# Patient Record
Sex: Female | Born: 1950 | Race: White | Hispanic: No | Marital: Married | Smoking: Never smoker
Health system: Southern US, Community
[De-identification: ages and names within clinical notes are randomized; demographics above are authoritative.]

## PROBLEM LIST (undated history)

## (undated) DIAGNOSIS — C50919 Malignant neoplasm of unspecified site of unspecified female breast: Secondary | ICD-10-CM

## (undated) DIAGNOSIS — I10 Essential (primary) hypertension: Secondary | ICD-10-CM

## (undated) DIAGNOSIS — B019 Varicella without complication: Secondary | ICD-10-CM

## (undated) DIAGNOSIS — G709 Myoneural disorder, unspecified: Secondary | ICD-10-CM

## (undated) DIAGNOSIS — M1990 Unspecified osteoarthritis, unspecified site: Secondary | ICD-10-CM

## (undated) DIAGNOSIS — J302 Other seasonal allergic rhinitis: Secondary | ICD-10-CM

## (undated) DIAGNOSIS — G4730 Sleep apnea, unspecified: Secondary | ICD-10-CM

## (undated) HISTORY — PX: BACK SURGERY: SHX140

## (undated) HISTORY — DX: Malignant neoplasm of unspecified site of unspecified female breast: C50.919

## (undated) HISTORY — PX: OTHER SURGICAL HISTORY: SHX169

## (undated) HISTORY — PX: COLON SURGERY: SHX602

## (undated) HISTORY — PX: DIAGNOSTIC LAPAROSCOPY: SUR761

## (undated) HISTORY — PX: DILATION AND CURETTAGE OF UTERUS: SHX78

## (undated) HISTORY — DX: Varicella without complication: B01.9

---

## 2010-06-03 DIAGNOSIS — L680 Hirsutism: Secondary | ICD-10-CM

## 2010-06-03 DIAGNOSIS — E221 Hyperprolactinemia: Secondary | ICD-10-CM | POA: Insufficient documentation

## 2010-06-03 HISTORY — DX: Hyperprolactinemia: E22.1

## 2010-06-03 HISTORY — DX: Hirsutism: L68.0

## 2011-02-25 LAB — HM DEXA SCAN: HM Dexa Scan: NORMAL

## 2017-01-10 DIAGNOSIS — Z86018 Personal history of other benign neoplasm: Secondary | ICD-10-CM | POA: Diagnosis not present

## 2017-01-10 DIAGNOSIS — G629 Polyneuropathy, unspecified: Secondary | ICD-10-CM | POA: Diagnosis not present

## 2017-01-10 DIAGNOSIS — Z6841 Body Mass Index (BMI) 40.0 and over, adult: Secondary | ICD-10-CM | POA: Diagnosis not present

## 2017-01-10 DIAGNOSIS — I10 Essential (primary) hypertension: Secondary | ICD-10-CM | POA: Diagnosis not present

## 2017-03-29 DIAGNOSIS — Z1211 Encounter for screening for malignant neoplasm of colon: Secondary | ICD-10-CM | POA: Diagnosis not present

## 2017-03-29 DIAGNOSIS — Z1212 Encounter for screening for malignant neoplasm of rectum: Secondary | ICD-10-CM | POA: Diagnosis not present

## 2017-03-30 LAB — COLOGUARD: Cologuard: NEGATIVE

## 2017-05-10 ENCOUNTER — Other Ambulatory Visit: Payer: Self-pay

## 2017-05-31 ENCOUNTER — Other Ambulatory Visit: Payer: Self-pay | Admitting: Internal Medicine

## 2017-05-31 DIAGNOSIS — Z1231 Encounter for screening mammogram for malignant neoplasm of breast: Secondary | ICD-10-CM

## 2017-06-02 ENCOUNTER — Ambulatory Visit
Admission: RE | Admit: 2017-06-02 | Discharge: 2017-06-02 | Disposition: A | Discharge status: Home / Self Care | Payer: Medicare Other | Source: Ambulatory Visit | Attending: Internal Medicine | Admitting: Internal Medicine

## 2017-06-02 ENCOUNTER — Encounter: Payer: Self-pay | Admitting: Radiology

## 2017-06-02 DIAGNOSIS — Z1231 Encounter for screening mammogram for malignant neoplasm of breast: Secondary | ICD-10-CM | POA: Diagnosis not present

## 2017-06-14 DIAGNOSIS — M25512 Pain in left shoulder: Secondary | ICD-10-CM | POA: Diagnosis not present

## 2017-09-06 DIAGNOSIS — M5412 Radiculopathy, cervical region: Secondary | ICD-10-CM | POA: Insufficient documentation

## 2017-09-06 DIAGNOSIS — M25512 Pain in left shoulder: Secondary | ICD-10-CM | POA: Diagnosis not present

## 2017-09-20 DIAGNOSIS — M5412 Radiculopathy, cervical region: Secondary | ICD-10-CM | POA: Diagnosis not present

## 2017-09-26 DIAGNOSIS — M5412 Radiculopathy, cervical region: Secondary | ICD-10-CM | POA: Diagnosis not present

## 2017-09-30 DIAGNOSIS — M5412 Radiculopathy, cervical region: Secondary | ICD-10-CM | POA: Diagnosis not present

## 2017-10-04 DIAGNOSIS — M5412 Radiculopathy, cervical region: Secondary | ICD-10-CM | POA: Diagnosis not present

## 2017-10-05 DIAGNOSIS — M5412 Radiculopathy, cervical region: Secondary | ICD-10-CM | POA: Diagnosis not present

## 2017-10-07 DIAGNOSIS — M5412 Radiculopathy, cervical region: Secondary | ICD-10-CM | POA: Diagnosis not present

## 2017-10-10 DIAGNOSIS — M50223 Other cervical disc displacement at C6-C7 level: Secondary | ICD-10-CM | POA: Diagnosis not present

## 2017-10-10 DIAGNOSIS — M47812 Spondylosis without myelopathy or radiculopathy, cervical region: Secondary | ICD-10-CM | POA: Diagnosis not present

## 2017-10-12 DIAGNOSIS — M5412 Radiculopathy, cervical region: Secondary | ICD-10-CM | POA: Diagnosis not present

## 2017-10-21 DIAGNOSIS — M5412 Radiculopathy, cervical region: Secondary | ICD-10-CM | POA: Diagnosis not present

## 2017-11-02 DIAGNOSIS — M5412 Radiculopathy, cervical region: Secondary | ICD-10-CM | POA: Diagnosis not present

## 2017-12-20 DIAGNOSIS — M542 Cervicalgia: Secondary | ICD-10-CM | POA: Diagnosis not present

## 2018-03-16 DIAGNOSIS — E7800 Pure hypercholesterolemia, unspecified: Secondary | ICD-10-CM | POA: Diagnosis not present

## 2018-03-16 DIAGNOSIS — I10 Essential (primary) hypertension: Secondary | ICD-10-CM | POA: Diagnosis not present

## 2018-03-16 DIAGNOSIS — Z86018 Personal history of other benign neoplasm: Secondary | ICD-10-CM | POA: Diagnosis not present

## 2018-03-16 DIAGNOSIS — G629 Polyneuropathy, unspecified: Secondary | ICD-10-CM | POA: Diagnosis not present

## 2018-03-16 DIAGNOSIS — N183 Chronic kidney disease, stage 3 (moderate): Secondary | ICD-10-CM | POA: Diagnosis not present

## 2018-03-16 DIAGNOSIS — N939 Abnormal uterine and vaginal bleeding, unspecified: Secondary | ICD-10-CM | POA: Diagnosis not present

## 2018-03-18 ENCOUNTER — Other Ambulatory Visit: Payer: Self-pay | Admitting: Family Medicine

## 2018-03-18 DIAGNOSIS — N939 Abnormal uterine and vaginal bleeding, unspecified: Secondary | ICD-10-CM

## 2018-03-27 ENCOUNTER — Ambulatory Visit
Admission: RE | Admit: 2018-03-27 | Discharge: 2018-03-27 | Disposition: A | Discharge status: Home / Self Care | Payer: Medicare Other | Source: Ambulatory Visit | Attending: Family Medicine | Admitting: Family Medicine

## 2018-03-27 DIAGNOSIS — N939 Abnormal uterine and vaginal bleeding, unspecified: Secondary | ICD-10-CM

## 2018-04-03 DIAGNOSIS — M79671 Pain in right foot: Secondary | ICD-10-CM | POA: Diagnosis not present

## 2018-04-17 DIAGNOSIS — R9389 Abnormal findings on diagnostic imaging of other specified body structures: Secondary | ICD-10-CM | POA: Diagnosis not present

## 2018-04-17 DIAGNOSIS — Z538 Procedure and treatment not carried out for other reasons: Secondary | ICD-10-CM | POA: Diagnosis not present

## 2018-04-17 DIAGNOSIS — N950 Postmenopausal bleeding: Secondary | ICD-10-CM | POA: Diagnosis not present

## 2018-04-17 DIAGNOSIS — N882 Stricture and stenosis of cervix uteri: Secondary | ICD-10-CM | POA: Diagnosis not present

## 2018-04-19 ENCOUNTER — Encounter (HOSPITAL_COMMUNITY): Payer: Self-pay | Admitting: *Deleted

## 2018-04-19 NOTE — Patient Instructions (Addendum)
Your procedure is scheduled on: Thursday, 9/19  Enter through the Main Entrance of [facility] at: 6 am  Pick up the phone at the desk and dial [phone].  Call this number if you have problems the morning of surgery: [phone].  Remember: Do NOT eat food or Do NOT drink clear liquids (including water) after midnight Wednesday.  Take these medicines the morning of surgery with a SIP OF WATER:  None  Brush your teeth on the day of surgery.  Bring CPAP with you on day of surgery.  Stop herbal medications, vitamin supplements, Ibuprofen/NSAIDS at this time.  Do NOT wear jewelry (body piercing), metal hair clips/bobby pins, make-up, or nail polish. Do NOT wear lotions, powders, or perfumes.  You may wear deoderant. Do NOT shave for 48 hours prior to surgery. Do NOT bring valuables to the hospital.  Have a responsible adult drive you home and stay with you for 24 hours after your procedure.  Home with Husband Christy Strickland cell [phone].

## 2018-04-21 DIAGNOSIS — Z01818 Encounter for other preprocedural examination: Secondary | ICD-10-CM | POA: Diagnosis not present

## 2018-04-23 NOTE — H&P (Signed)
67yo PM female who presents for hysteroscopy, D&C under ultrasound guidance due to PMB and cervical stenosis. She reports that on 8/4-8/11 she had light pink to red spotting. The bleeding was most noticeable when she went to the bathroom and wiped. It barely required even a pantyliner. Prior to this it had been several years since she had bleeding.  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  03/27/18: [place] 7.4cm uterus with thickened lining- 14cm. Ovaries not seen.   Current Medications  Taking   HCTZ 25 MG 25 MG Tablet 1 tablet Orally q AM   Vitamin C Immune Health(Ascorbic Acid) 500 MG Tablet Chewable 1 tablet Orally Once a day   Multivitamin . Tablet 1 tablet by mouth Once daily   Unknown   Indomethacin 50 MG Capsule 1 capsule with food or milk Orally three times aday   Medication List reviewed and reconciled with the patient    Past Medical History  Hypertension.   Menstrual Cycle stopped at 15.   Partial Pituitary Removal.   Hormone Issues.   Sleep Apnea.   Stage 3 CKD.   Obesity.   Peripheral neuropathy.   Hyperlipidemia.    Surgical History  Lower Back Surgery   2 C-Sections   Colonoscopy   2 D & C   2 Total Knee Replacement   Abdominal Mass ( benign)    Family History  No Family History documented.   Social History  General:  Alcohol: Rare.  Children: 2, Boys.  Caffeine: 1 serving daily, coffee.  DIET: drinks water.  EDUCATION: Some College.  Tobacco use  cigarettes: Never smoked Tobacco history last updated 04/03/2018 Marital Status: married.  OCCUPATION: House wife.  Exercise: 3 times weekly.    Gyn History  Last pap smear date 2016.  Last mammogram date 2018.    OB History  Pregnancy # 1 NSVD x 2.    Allergies  Demerol: Aggitates  Augmentin  Mushroom: Itching   Hospitalization/Major Diagnostic Procedure  Meningitis   Child Birth x 2   Breast Infection    Review of Systems  CONSTITUTIONAL:  no Chills.  no Fever. no Night sweats.  HEENT:  Blurrred vision no. no Double vision.  CARDIOLOGY:  no Chest pain.  RESPIRATORY:  no Shortness of breath. no Cough.  UROLOGY:  no Urinary frequency. no Urinary incontinence. no Urinary urgency.  GASTROENTEROLOGY:  no Abdominal pain. no Appetite change. Bloating/belching yes. no Change in bowel movements.  FEMALE REPRODUCTIVE:  no Breast lumps or discharge. no Breast pain.  NEUROLOGY:  no Dizziness. no Headache. no Loss of consciousness. Tingling/numbness yes.  PSYCHOLOGY:  no Anxiety. no Depression.  SKIN:  no Rash. no Hives.  HEMATOLOGY/LYMPH:  no Anemia. no Fatigue. Using Blood Thinners no.     Vital Signs  Wt 261.4, Wt change -3.4 lb, Ht 66, BMI 42.19, Pulse sitting 80, BP sitting 140/90.   Examination  General Examination: CONSTITUTIONAL: well developed, well nourished.  SKIN: warm and dry, no rashes.  NECK: supple, normal appearance.  LUNGS: clear to auscultation bilaterally, no wheezes, rhonchi, rales.  HEART: no murmurs, regular rate and rhythm.  ABDOMEN: obese, soft and non-tender, no rebound, no guarding.  FEMALE GENITOURINARY: normal external genitalia, labia - unremarkable, vagina - pink moist mucosa, no lesions or abnormal discharge, difficult visualizing cervix- long vaginal canal-cervix visualized- os not appreciated.  MUSCULOSKELETAL no calf tenderness bilaterally.  EXTREMITIES: no edema present.  PSYCH: appropriate mood and affect.     A/P: 15XW  PM female who presents for hysteroscopy, D&C with ultrasound guidance due to cervical stenosis and bleeding -NPO -LR @ 125cc/hr -antibiotics not indicated -SCDs to OR -Benefits, risk and alternatives reviewed with patient including but not limited to risk of bleeding, infection, uterine perforation or inability to complete procedure  Janyth Pupa, DO [phone] (cell) [phone] (office)

## 2018-04-24 ENCOUNTER — Other Ambulatory Visit: Payer: Self-pay

## 2018-04-24 ENCOUNTER — Encounter (HOSPITAL_COMMUNITY)
Admission: RE | Admit: 2018-04-24 | Discharge: 2018-04-24 | Disposition: A | Discharge status: Home / Self Care | Payer: Medicare Other | Source: Ambulatory Visit | Attending: Obstetrics & Gynecology | Admitting: Obstetrics & Gynecology

## 2018-04-24 ENCOUNTER — Encounter (HOSPITAL_COMMUNITY): Payer: Self-pay

## 2018-04-24 DIAGNOSIS — N950 Postmenopausal bleeding: Secondary | ICD-10-CM

## 2018-04-24 DIAGNOSIS — Z79899 Other long term (current) drug therapy: Secondary | ICD-10-CM | POA: Diagnosis not present

## 2018-04-24 DIAGNOSIS — Z01818 Encounter for other preprocedural examination: Secondary | ICD-10-CM | POA: Insufficient documentation

## 2018-04-24 DIAGNOSIS — G4730 Sleep apnea, unspecified: Secondary | ICD-10-CM | POA: Diagnosis not present

## 2018-04-24 DIAGNOSIS — E785 Hyperlipidemia, unspecified: Secondary | ICD-10-CM | POA: Diagnosis not present

## 2018-04-24 DIAGNOSIS — I129 Hypertensive chronic kidney disease with stage 1 through stage 4 chronic kidney disease, or unspecified chronic kidney disease: Secondary | ICD-10-CM | POA: Diagnosis not present

## 2018-04-24 DIAGNOSIS — E669 Obesity, unspecified: Secondary | ICD-10-CM | POA: Diagnosis not present

## 2018-04-24 DIAGNOSIS — G629 Polyneuropathy, unspecified: Secondary | ICD-10-CM | POA: Diagnosis not present

## 2018-04-24 DIAGNOSIS — Z6841 Body Mass Index (BMI) 40.0 and over, adult: Secondary | ICD-10-CM | POA: Diagnosis not present

## 2018-04-24 DIAGNOSIS — N882 Stricture and stenosis of cervix uteri: Secondary | ICD-10-CM | POA: Diagnosis not present

## 2018-04-24 DIAGNOSIS — N183 Chronic kidney disease, stage 3 (moderate): Secondary | ICD-10-CM | POA: Diagnosis not present

## 2018-04-24 DIAGNOSIS — Z96659 Presence of unspecified artificial knee joint: Secondary | ICD-10-CM | POA: Diagnosis not present

## 2018-04-24 DIAGNOSIS — N840 Polyp of corpus uteri: Secondary | ICD-10-CM | POA: Diagnosis not present

## 2018-04-24 HISTORY — DX: Myoneural disorder, unspecified: G70.9

## 2018-04-24 HISTORY — DX: Other seasonal allergic rhinitis: J30.2

## 2018-04-24 HISTORY — DX: Sleep apnea, unspecified: G47.30

## 2018-04-24 LAB — CBC
HCT: 44.3 % (ref 36.0–46.0)
Hemoglobin: 14.9 g/dL (ref 12.0–15.0)
MCH: 30.7 pg (ref 26.0–34.0)
MCHC: 33.6 g/dL (ref 30.0–36.0)
MCV: 91.3 fL (ref 78.0–100.0)
PLATELETS: 374 10*3/uL (ref 150–400)
RBC: 4.85 MIL/uL (ref 3.87–5.11)
RDW: 13.2 % (ref 11.5–15.5)
WBC: 6.4 10*3/uL (ref 4.0–10.5)

## 2018-04-24 LAB — COMPREHENSIVE METABOLIC PANEL
ALT: 19 U/L (ref 0–44)
ANION GAP: 10 (ref 5–15)
AST: 19 U/L (ref 15–41)
Albumin: 4.1 g/dL (ref 3.5–5.0)
Alkaline Phosphatase: 59 U/L (ref 38–126)
BUN: 16 mg/dL (ref 8–23)
CHLORIDE: 99 mmol/L (ref 98–111)
CO2: 28 mmol/L (ref 22–32)
Calcium: 10 mg/dL (ref 8.9–10.3)
Creatinine, Ser: 1.02 mg/dL — ABNORMAL HIGH (ref 0.44–1.00)
GFR calc non Af Amer: 56 mL/min — ABNORMAL LOW (ref >60)
Glucose, Bld: 87 mg/dL (ref 70–99)
Potassium: 3.5 mmol/L (ref 3.5–5.1)
SODIUM: 137 mmol/L (ref 135–145)
Total Bilirubin: 0.6 mg/dL (ref 0.3–1.2)
Total Protein: 8.4 g/dL — ABNORMAL HIGH (ref 6.5–8.1)

## 2018-04-24 NOTE — Progress Notes (Signed)
Called patient to inform her to bring CPAP machine with her on day of surgery.

## 2018-04-24 NOTE — Pre-Procedure Instructions (Signed)
Called patient to inform her to bring CPAP machine with her on day to surgery.  Patient verbalized understanding.

## 2018-04-26 NOTE — Anesthesia Preprocedure Evaluation (Signed)
Anesthesia Evaluation  Patient identified by MRN, date of birth, ID band Patient awake    Reviewed: Allergy & Precautions, H&P , NPO status , Patient's Chart, lab work & pertinent test results, reviewed documented beta blocker date and time   Airway Mallampati: II  TM Distance: >3 FB Neck ROM: full    Dental no notable dental hx.    Pulmonary neg pulmonary ROS,    Pulmonary exam normal breath sounds clear to auscultation       Cardiovascular Exercise Tolerance: Good hypertension, Pt. on medications negative cardio ROS   Rhythm:regular Rate:Normal     Neuro/Psych negative neurological ROS  negative psych ROS   GI/Hepatic negative GI ROS, Neg liver ROS,   Endo/Other  negative endocrine ROS  Renal/GU negative Renal ROS  negative genitourinary   Musculoskeletal   Abdominal   Peds  Hematology negative hematology ROS (+)   Anesthesia Other Findings   Reproductive/Obstetrics negative OB ROS                             Anesthesia Physical Anesthesia Plan  ASA: II  Anesthesia Plan: General   Post-op Pain Management:    Induction: Intravenous  PONV Risk Score and Plan: 3 and Ondansetron, Treatment may vary due to age or medical condition, Propofol infusion and Dexamethasone  Airway Management Planned: LMA  Additional Equipment:   Intra-op Plan:   Post-operative Plan: Extubation in OR  Informed Consent: I have reviewed the patients History and Physical, chart, labs and discussed the procedure including the risks, benefits and alternatives for the proposed anesthesia with the patient or authorized representative who has indicated his/her understanding and acceptance.   Dental Advisory Given  Plan Discussed with: CRNA, Surgeon and Anesthesiologist  Anesthesia Plan Comments: ( )        Anesthesia Quick Evaluation

## 2018-04-27 ENCOUNTER — Ambulatory Visit (HOSPITAL_COMMUNITY): Payer: Medicare Other

## 2018-04-27 ENCOUNTER — Ambulatory Visit (HOSPITAL_COMMUNITY): Payer: Medicare Other | Admitting: Anesthesiology

## 2018-04-27 ENCOUNTER — Encounter (HOSPITAL_COMMUNITY)
Admission: RE | Disposition: A | Discharge status: Home / Self Care | Payer: Self-pay | Source: Ambulatory Visit | Attending: Obstetrics & Gynecology

## 2018-04-27 ENCOUNTER — Ambulatory Visit (HOSPITAL_COMMUNITY)
Admission: RE | Admit: 2018-04-27 | Discharge: 2018-04-27 | Disposition: A | Discharge status: Home / Self Care | Payer: Medicare Other | Source: Ambulatory Visit | Attending: Obstetrics & Gynecology | Admitting: Obstetrics & Gynecology

## 2018-04-27 ENCOUNTER — Encounter (HOSPITAL_COMMUNITY): Payer: Self-pay | Admitting: Emergency Medicine

## 2018-04-27 DIAGNOSIS — Z96659 Presence of unspecified artificial knee joint: Secondary | ICD-10-CM | POA: Insufficient documentation

## 2018-04-27 DIAGNOSIS — N882 Stricture and stenosis of cervix uteri: Secondary | ICD-10-CM | POA: Diagnosis not present

## 2018-04-27 DIAGNOSIS — E785 Hyperlipidemia, unspecified: Secondary | ICD-10-CM | POA: Insufficient documentation

## 2018-04-27 DIAGNOSIS — N950 Postmenopausal bleeding: Secondary | ICD-10-CM | POA: Diagnosis not present

## 2018-04-27 DIAGNOSIS — N183 Chronic kidney disease, stage 3 (moderate): Secondary | ICD-10-CM | POA: Diagnosis not present

## 2018-04-27 DIAGNOSIS — N840 Polyp of corpus uteri: Secondary | ICD-10-CM | POA: Diagnosis not present

## 2018-04-27 DIAGNOSIS — Z419 Encounter for procedure for purposes other than remedying health state, unspecified: Secondary | ICD-10-CM

## 2018-04-27 DIAGNOSIS — Z79899 Other long term (current) drug therapy: Secondary | ICD-10-CM | POA: Diagnosis not present

## 2018-04-27 DIAGNOSIS — Z6841 Body Mass Index (BMI) 40.0 and over, adult: Secondary | ICD-10-CM | POA: Diagnosis not present

## 2018-04-27 DIAGNOSIS — G629 Polyneuropathy, unspecified: Secondary | ICD-10-CM | POA: Insufficient documentation

## 2018-04-27 DIAGNOSIS — R9389 Abnormal findings on diagnostic imaging of other specified body structures: Secondary | ICD-10-CM | POA: Diagnosis not present

## 2018-04-27 DIAGNOSIS — I129 Hypertensive chronic kidney disease with stage 1 through stage 4 chronic kidney disease, or unspecified chronic kidney disease: Secondary | ICD-10-CM | POA: Diagnosis not present

## 2018-04-27 DIAGNOSIS — E669 Obesity, unspecified: Secondary | ICD-10-CM | POA: Diagnosis not present

## 2018-04-27 DIAGNOSIS — G4730 Sleep apnea, unspecified: Secondary | ICD-10-CM | POA: Diagnosis not present

## 2018-04-27 HISTORY — DX: Essential (primary) hypertension: I10

## 2018-04-27 HISTORY — PX: DILATATION & CURETTAGE/HYSTEROSCOPY WITH MYOSURE: SHX6511

## 2018-04-27 SURGERY — DILATATION & CURETTAGE/HYSTEROSCOPY WITH MYOSURE
Anesthesia: General

## 2018-04-27 MED ORDER — LIDOCAINE-EPINEPHRINE 1 %-1:100000 IJ SOLN
INTRAMUSCULAR | Status: DC | PRN
  Administered 2018-04-27: 7 mL

## 2018-04-27 MED ORDER — ONDANSETRON HCL 4 MG/2ML IJ SOLN: 4.0000 mg | Freq: Once | INTRAMUSCULAR | Status: DC | PRN

## 2018-04-27 MED ORDER — PROPOFOL 10 MG/ML IV BOLUS
INTRAVENOUS | Status: AC
  Filled 2018-04-27: qty 20

## 2018-04-27 MED ORDER — LACTATED RINGERS IV SOLN
INTRAVENOUS | Status: DC
  Administered 2018-04-27: 06:00:00 via INTRAVENOUS

## 2018-04-27 MED ORDER — KETOROLAC TROMETHAMINE 30 MG/ML IJ SOLN
INTRAMUSCULAR | Status: AC
  Filled 2018-04-27: qty 1

## 2018-04-27 MED ORDER — MEPERIDINE HCL 25 MG/ML IJ SOLN: 6.2500 mg | INTRAMUSCULAR | Status: DC | PRN

## 2018-04-27 MED ORDER — MIDAZOLAM HCL 2 MG/2ML IJ SOLN
INTRAMUSCULAR | Status: AC
  Filled 2018-04-27: qty 2

## 2018-04-27 MED ORDER — FENTANYL CITRATE (PF) 100 MCG/2ML IJ SOLN
INTRAMUSCULAR | Status: AC
  Filled 2018-04-27: qty 2

## 2018-04-27 MED ORDER — OXYCODONE HCL 5 MG PO TABS: 5.0000 mg | ORAL_TABLET | Freq: Once | ORAL | Status: DC | PRN

## 2018-04-27 MED ORDER — ACETAMINOPHEN 325 MG PO TABS: 325.0000 mg | ORAL_TABLET | ORAL | Status: DC | PRN

## 2018-04-27 MED ORDER — KETOROLAC TROMETHAMINE 30 MG/ML IJ SOLN
INTRAMUSCULAR | Status: DC | PRN
  Administered 2018-04-27: 15 mg via INTRAVENOUS

## 2018-04-27 MED ORDER — DEXAMETHASONE SODIUM PHOSPHATE 10 MG/ML IJ SOLN
INTRAMUSCULAR | Status: DC | PRN
  Administered 2018-04-27: 4 mg via INTRAVENOUS

## 2018-04-27 MED ORDER — OXYCODONE HCL 5 MG/5ML PO SOLN: 5.0000 mg | Freq: Once | ORAL | Status: DC | PRN

## 2018-04-27 MED ORDER — PROPOFOL 10 MG/ML IV BOLUS
INTRAVENOUS | Status: DC | PRN
  Administered 2018-04-27: 150 mg via INTRAVENOUS

## 2018-04-27 MED ORDER — FENTANYL CITRATE (PF) 100 MCG/2ML IJ SOLN: 25.0000 ug | INTRAMUSCULAR | Status: DC | PRN

## 2018-04-27 MED ORDER — MIDAZOLAM HCL 2 MG/2ML IJ SOLN
INTRAMUSCULAR | Status: DC | PRN
  Administered 2018-04-27: 1 mg via INTRAVENOUS

## 2018-04-27 MED ORDER — LIDOCAINE HCL (CARDIAC) PF 100 MG/5ML IV SOSY
PREFILLED_SYRINGE | INTRAVENOUS | Status: AC
  Filled 2018-04-27: qty 5

## 2018-04-27 MED ORDER — ACETAMINOPHEN 160 MG/5ML PO SOLN: 325.0000 mg | ORAL | Status: DC | PRN

## 2018-04-27 MED ORDER — ONDANSETRON HCL 4 MG/2ML IJ SOLN
INTRAMUSCULAR | Status: AC
  Filled 2018-04-27: qty 2

## 2018-04-27 MED ORDER — ONDANSETRON HCL 4 MG/2ML IJ SOLN
INTRAMUSCULAR | Status: DC | PRN
  Administered 2018-04-27: 4 mg via INTRAVENOUS

## 2018-04-27 MED ORDER — SODIUM CHLORIDE 0.9 % IR SOLN
Status: DC | PRN
  Administered 2018-04-27: 3000 mL

## 2018-04-27 MED ORDER — DEXAMETHASONE SODIUM PHOSPHATE 4 MG/ML IJ SOLN
INTRAMUSCULAR | Status: AC
  Filled 2018-04-27: qty 1

## 2018-04-27 MED ORDER — LIDOCAINE HCL (CARDIAC) PF 100 MG/5ML IV SOSY
PREFILLED_SYRINGE | INTRAVENOUS | Status: DC | PRN
  Administered 2018-04-27: 50 mg via INTRAVENOUS

## 2018-04-27 MED ORDER — FENTANYL CITRATE (PF) 100 MCG/2ML IJ SOLN
INTRAMUSCULAR | Status: DC | PRN
  Administered 2018-04-27 (×2): 50 ug via INTRAVENOUS

## 2018-04-27 SURGICAL SUPPLY — 15 items
CANISTER SUCT 3000ML PPV (MISCELLANEOUS) ×3 IMPLANT
CATH ROBINSON RED A/P 16FR (CATHETERS) ×3 IMPLANT
DEVICE MYOSURE LITE (MISCELLANEOUS) ×3 IMPLANT
DEVICE MYOSURE REACH (MISCELLANEOUS) IMPLANT
DILATOR CANAL MILEX (MISCELLANEOUS) ×3 IMPLANT
GLOVE BIOGEL PI IND STRL 7.0 (GLOVE) ×4 IMPLANT
GLOVE BIOGEL PI INDICATOR 7.0 (GLOVE) ×2
GLOVE ECLIPSE 6.5 STRL STRAW (GLOVE) ×3 IMPLANT
GOWN STRL REUS W/TWL LRG LVL3 (GOWN DISPOSABLE) ×6 IMPLANT
PACK VAGINAL MINOR WOMEN LF (CUSTOM PROCEDURE TRAY) ×3 IMPLANT
PAD OB MATERNITY 4.3X12.25 (PERSONAL CARE ITEMS) ×3 IMPLANT
SEAL ROD LENS SCOPE MYOSURE (ABLATOR) ×3 IMPLANT
TOWEL OR 17X24 6PK STRL BLUE (TOWEL DISPOSABLE) ×6 IMPLANT
TUBING AQUILEX INFLOW (TUBING) ×3 IMPLANT
TUBING AQUILEX OUTFLOW (TUBING) ×3 IMPLANT

## 2018-04-27 NOTE — Transfer of Care (Signed)
Immediate Anesthesia Transfer of Care Note  Patient: Christy Strickland  Procedure(s) Performed: DILATATION & CURETTAGE/HYSTEROSCOPY WITH MYOSURE  Patient Location: PACU  Anesthesia Type:General  Level of Consciousness: awake, alert  and oriented  Airway & Oxygen Therapy: Patient Spontanous Breathing and Patient connected to nasal cannula oxygen  Post-op Assessment: Report given to RN and Post -op Vital signs reviewed and stable  Post vital signs: Reviewed and stable  Last Vitals:  Vitals Value Taken Time  BP 160/81 04/27/2018  8:02 AM  Temp    Pulse 66 04/27/2018  8:05 AM  Resp 14 04/27/2018  8:05 AM  SpO2 95 % 04/27/2018  8:05 AM  Vitals shown include unvalidated device data.  Last Pain:  Vitals:   04/27/18 0622  TempSrc: Oral      Patients Stated Pain Goal: 4 (34/19/37 9024)  Complications: No apparent anesthesia complications

## 2018-04-27 NOTE — Discharge Instructions (Addendum)
HOME INSTRUCTIONS  Please note any unusual or excessive bleeding, pain, swelling. Mild dizziness or drowsiness are normal for about 24 hours after surgery.   Shower when comfortable  Restrictions: No driving for 24 hours or while taking pain medications.  Activity:  No heavy lifting (> 10 lbs), nothing in vagina (no tampons, douching, or intercourse) x 4 weeks; no tub baths for 4 weeks Vaginal spotting is expected but if your bleeding is heavy, period like,  please call the office   Incision: the bandaids will fall off when they are ready to; you may clean your incision with mild soap and water but do not rub or scrub the incision site.  You may experience slight bloody drainage from your incision periodically.  This is normal.  If you experience a large amount of drainage or the incision opens, please call your physician who will likely direct you to the emergency department.  Diet:  You may return to your regular diet.  Do not eat large meals.  Eat small frequent meals throughout the day.  Continue to drink a good amount of water at least 6-8 glasses of water per day, hydration is very important for the healing process.  Pain Management: Take Motrin and/or Percocet as prescribed/needed for pain.  Always take prescription pain medication with food, it may cause constipation, increase fluids and fiber and you may want to take an over-the-counter stool softener like Colace as needed up to 2x a day.    Alcohol -- Avoid for 24 hours and while taking pain medications.  Nausea: Take sips of ginger ale or soda  Fever -- Call physician if temperature over 101 degrees  Follow up:  If you experience fever (a temperature greater than 100.4), pain unrelieved by pain medication, shortness of breath, swelling of a single leg, or any other symptoms which are concerning to you please the office immediately.  DISCHARGE INSTRUCTIONS: HYSTEROSCOPY / ENDOMETRIAL ABLATION The following instructions have  been prepared to help you care for yourself upon your return home.  May Remove Scop patch on or before  May take Ibuprofen after  May take stool softner while taking narcotic pain medication to prevent constipation.  Drink plenty of water.  Personal hygiene:  Use sanitary pads for vaginal drainage, not tampons.  Shower the day after your procedure.  NO tub baths, pools or Jacuzzis for 2-3 weeks.  Wipe front to back after using the bathroom.  Activity and limitations:  Do NOT drive or operate any equipment for 24 hours. The effects of anesthesia are still present and drowsiness may result.  Do NOT rest in bed all day.  Walking is encouraged.  Walk up and down stairs slowly.  You may resume your normal activity in one to two days or as indicated by your physician. Sexual activity: NO intercourse for at least 2 weeks after the procedure, or as indicated by your Doctor.  Diet: Eat a light meal as desired this evening. You may resume your usual diet tomorrow.  Return to Work: You may resume your work activities in one to two days or as indicated by Marine scientist.  What to expect after your surgery: Expect to have vaginal bleeding/discharge for 2-3 days and spotting for up to 10 days. It is not unusual to have soreness for up to 1-2 weeks. You may have a slight burning sensation when you urinate for the first day. Mild cramps may continue for a couple of days. You may have a regular period in  2-6 weeks.  Call your doctor for any of the following:  Excessive vaginal bleeding or clotting, saturating and changing one pad every hour.  Inability to urinate 6 hours after discharge from hospital.  Pain not relieved by pain medication.  Fever of 100.4 F or greater.  Unusual vaginal discharge or odor.   Post Anesthesia Home Care Instructions  Activity: Get plenty of rest for the remainder of the day. A responsible individual must stay with you for 24 hours following the  procedure.  For the next 24 hours, DO NOT: -Drive a car -Paediatric nurse -Drink alcoholic beverages -Take any medication unless instructed by your physician -Make any legal decisions or sign important papers.  Meals: Start with liquid foods such as gelatin or soup. Progress to regular foods as tolerated. Avoid greasy, spicy, heavy foods. If nausea and/or vomiting occur, drink only clear liquids until the nausea and/or vomiting subsides. Call your physician if vomiting continues.  Special Instructions/Symptoms: Your throat may feel dry or sore from the anesthesia or the breathing tube placed in your throat during surgery. If this causes discomfort, gargle with warm salt water. The discomfort should disappear within 24 hours.

## 2018-04-27 NOTE — Op Note (Signed)
Operative Report  PreOp: 1) postemenopausal bleeding 2) Cervical stenosis PostOp: same Procedure:  Hysteroscopy, Dilation and Curettage, myosure resection Surgeon: Dr. Janyth Pupa Anesthesia: General Complications:none EBL: 09BD UOP: 50cc IVF:400cc  Findings: 7cm uterus with thickened endometrium  Specimens: 1) endometrial curettings   Procedure: The patient was taken to the operating room where she underwent general anesthesia without difficulty. The patient was placed in a low lithotomy position using Allen stirrups. The patient was examined with the findings as noted above.  She was then prepped and draped in the normal sterile fashion. The bladder was drained using a red rubber urethral catheter. A sterile speculum was inserted into the vagina. 1% Lidocaine was used for a cervical block.  A single tooth tenaculum was placed on the anterior lip of the cervix. Using initially lacrimal ducts then serial dilation, the uterine cavity was entered.  The uterus was then sounded to 7cm. The endocervical canal was then serially dilated to 14French using Hank dilators.  The diagnostic hysteroscope was then inserted without difficulty and noted to have the findings as listed above. The hysteroscope was removed and sharp curettage was performed. Hysteroscope was reinserted and concern for inadequate sampling was noted near the fundus.  Myosure was used for resection of the thickened endometrium.  The tissue was sent to pathology. No uterine perforation was seen.  All instrument were then removed. Hemostasis was observed at the cervical site. The patient was repositioned to the supine position. The patient tolerated the procedure without any complications and taken to recovery in stable condition.   Janyth Pupa, DO [phone] (pager) [phone] (office)

## 2018-04-27 NOTE — Anesthesia Procedure Notes (Signed)
Procedure Name: LMA Insertion Date/Time: 04/27/2018 7:27 AM Performed by: Bufford Spikes, CRNA Pre-anesthesia Checklist: Patient identified, Emergency Drugs available, Suction available and Patient being monitored Patient Re-evaluated:Patient Re-evaluated prior to induction Oxygen Delivery Method: Circle system utilized Preoxygenation: Pre-oxygenation with 100% oxygen Induction Type: IV induction Ventilation: Mask ventilation without difficulty LMA: LMA inserted LMA Size: 4.0 Number of attempts: 1 Airway Equipment and Method: Bite block Placement Confirmation: positive ETCO2 Tube secured with: Tape Dental Injury: Teeth and Oropharynx as per pre-operative assessment

## 2018-04-27 NOTE — Anesthesia Postprocedure Evaluation (Signed)
Anesthesia Post Note  Patient: Christy Strickland  Procedure(s) Performed: [place]     Patient location during evaluation: PACU Anesthesia Type: General Level of consciousness: awake and alert Pain management: pain level controlled Vital Signs Assessment: post-procedure vital signs reviewed and stable Respiratory status: spontaneous breathing, nonlabored ventilation, respiratory function stable and patient connected to nasal cannula oxygen Cardiovascular status: blood pressure returned to baseline and stable Postop Assessment: no apparent nausea or vomiting Anesthetic complications: no    Last Vitals:  Vitals:   04/27/18 0845 04/27/18 0858  BP: (!) 167/76 (!) 164/77  Pulse: (!) 54 (!) 55  Resp: 17 17  Temp:    SpO2: 96% 96%    Last Pain:  Vitals:   04/27/18 0858  TempSrc:   PainSc: 0-No pain   Pain Goal: Patients Stated Pain Goal: 4 (04/27/18 0622)               Neville Walston

## 2018-04-27 NOTE — Interval H&P Note (Signed)
History and Physical Interval Note:  04/27/2018 6:44 AM  Christy Strickland  has presented today for surgery, with the diagnosis of N95.0 Postmenopausal bleedin  The various methods of treatment have been discussed with the patient and family. After consideration of risks, benefits and other options for treatment, the patient has consented to  Procedure(s): HYSTEROSCOPY (N/A) with D&C as a surgical intervention .  The patient's history has been reviewed, patient examined, no change in status, stable for surgery.  I have reviewed the patient's chart and labs.  Questions were answered to the patient's satisfaction.     Annalee Genta

## 2018-04-28 ENCOUNTER — Encounter (HOSPITAL_COMMUNITY): Payer: Self-pay | Admitting: Obstetrics & Gynecology

## 2019-09-20 DIAGNOSIS — I10 Essential (primary) hypertension: Secondary | ICD-10-CM | POA: Diagnosis not present

## 2019-09-24 DIAGNOSIS — I10 Essential (primary) hypertension: Secondary | ICD-10-CM | POA: Diagnosis not present

## 2019-10-11 ENCOUNTER — Ambulatory Visit: Payer: Medicare Other | Attending: Internal Medicine

## 2019-10-11 DIAGNOSIS — Z23 Encounter for immunization: Secondary | ICD-10-CM | POA: Insufficient documentation

## 2019-10-11 NOTE — Progress Notes (Signed)
   Covid-19 Vaccination Clinic  Name:  Christy Strickland    MRN: [id number] DOB: 1951-04-02  10/11/2019  [name] was observed post Covid-19 immunization for 15 minutes without incident. She was provided with Vaccine Information Sheet and instruction to access the V-Safe system.   [name] was instructed to call 911 with any severe reactions post vaccine: Marland Kitchen Difficulty breathing  . Swelling of face and throat  . A fast heartbeat  . A bad rash all over body  . Dizziness and weakness

## 2019-11-07 ENCOUNTER — Ambulatory Visit: Payer: Medicare Other | Attending: Internal Medicine

## 2019-11-07 DIAGNOSIS — Z23 Encounter for immunization: Secondary | ICD-10-CM

## 2019-11-07 NOTE — Progress Notes (Signed)
   Covid-19 Vaccination Clinic  Name:  Christy Strickland    MRN: [id number] DOB: 17-Mar-1951  11/07/2019  [name] was observed post Covid-19 immunization for 15 minutes without incident. She was provided with Vaccine Information Sheet and instruction to access the V-Safe system.   [name] was instructed to call 911 with any severe reactions post vaccine: Marland Kitchen Difficulty breathing  . Swelling of face and throat  . A fast heartbeat  . A bad rash all over body  . Dizziness and weakness   Immunizations Administered    Name Date Dose VIS Date Route   Pfizer COVID-19 Vaccine 11/07/2019 10:26 AM 0.3 mL 07/20/2019 Intramuscular   Manufacturer: [employer], [employer]   Lot: [id number]   [place]: [id number]

## 2020-06-16 DIAGNOSIS — Z23 Encounter for immunization: Secondary | ICD-10-CM | POA: Diagnosis not present

## 2021-01-07 ENCOUNTER — Other Ambulatory Visit: Payer: Self-pay | Admitting: Diagnostic Radiology

## 2021-01-07 DIAGNOSIS — E782 Mixed hyperlipidemia: Secondary | ICD-10-CM | POA: Diagnosis not present

## 2021-01-07 DIAGNOSIS — G629 Polyneuropathy, unspecified: Secondary | ICD-10-CM | POA: Diagnosis not present

## 2021-01-07 DIAGNOSIS — M2550 Pain in unspecified joint: Secondary | ICD-10-CM | POA: Diagnosis not present

## 2021-01-07 DIAGNOSIS — Z86018 Personal history of other benign neoplasm: Secondary | ICD-10-CM | POA: Diagnosis not present

## 2021-01-07 DIAGNOSIS — I10 Essential (primary) hypertension: Secondary | ICD-10-CM | POA: Diagnosis not present

## 2021-01-07 DIAGNOSIS — N183 Chronic kidney disease, stage 3 unspecified: Secondary | ICD-10-CM | POA: Diagnosis not present

## 2021-01-07 DIAGNOSIS — M1040 Other secondary gout, unspecified site: Secondary | ICD-10-CM | POA: Diagnosis not present

## 2021-01-07 DIAGNOSIS — N939 Abnormal uterine and vaginal bleeding, unspecified: Secondary | ICD-10-CM

## 2021-03-04 ENCOUNTER — Ambulatory Visit
Admission: RE | Admit: 2021-03-04 | Discharge: 2021-03-04 | Disposition: A | Discharge status: Home / Self Care | Payer: Medicare Other | Source: Ambulatory Visit | Attending: Diagnostic Radiology | Admitting: Diagnostic Radiology

## 2021-03-04 DIAGNOSIS — Z1231 Encounter for screening mammogram for malignant neoplasm of breast: Secondary | ICD-10-CM | POA: Diagnosis not present

## 2021-03-04 DIAGNOSIS — N939 Abnormal uterine and vaginal bleeding, unspecified: Secondary | ICD-10-CM

## 2021-03-06 ENCOUNTER — Other Ambulatory Visit: Payer: Self-pay | Admitting: Diagnostic Radiology

## 2021-03-06 ENCOUNTER — Other Ambulatory Visit: Payer: Self-pay | Admitting: Family Medicine

## 2021-03-06 DIAGNOSIS — R928 Other abnormal and inconclusive findings on diagnostic imaging of breast: Secondary | ICD-10-CM

## 2021-03-12 ENCOUNTER — Ambulatory Visit
Admission: RE | Admit: 2021-03-12 | Discharge: 2021-03-12 | Disposition: A | Discharge status: Home / Self Care | Payer: Medicare Other | Source: Ambulatory Visit | Attending: Family Medicine | Admitting: Family Medicine

## 2021-03-12 ENCOUNTER — Other Ambulatory Visit: Payer: Self-pay

## 2021-03-12 ENCOUNTER — Other Ambulatory Visit: Payer: Self-pay | Admitting: Family Medicine

## 2021-03-12 DIAGNOSIS — R928 Other abnormal and inconclusive findings on diagnostic imaging of breast: Secondary | ICD-10-CM

## 2021-03-12 DIAGNOSIS — N6321 Unspecified lump in the left breast, upper outer quadrant: Secondary | ICD-10-CM | POA: Diagnosis not present

## 2021-03-16 ENCOUNTER — Ambulatory Visit
Admission: RE | Admit: 2021-03-16 | Discharge: 2021-03-16 | Disposition: A | Discharge status: Home / Self Care | Payer: Medicare Other | Source: Ambulatory Visit | Attending: Family Medicine | Admitting: Family Medicine

## 2021-03-16 ENCOUNTER — Other Ambulatory Visit: Payer: Self-pay

## 2021-03-16 DIAGNOSIS — R590 Localized enlarged lymph nodes: Secondary | ICD-10-CM | POA: Diagnosis not present

## 2021-03-16 DIAGNOSIS — Z170 Estrogen receptor positive status [ER+]: Secondary | ICD-10-CM | POA: Diagnosis not present

## 2021-03-16 DIAGNOSIS — N6321 Unspecified lump in the left breast, upper outer quadrant: Secondary | ICD-10-CM | POA: Diagnosis not present

## 2021-03-16 DIAGNOSIS — R928 Other abnormal and inconclusive findings on diagnostic imaging of breast: Secondary | ICD-10-CM

## 2021-03-16 DIAGNOSIS — C50412 Malignant neoplasm of upper-outer quadrant of left female breast: Secondary | ICD-10-CM | POA: Diagnosis not present

## 2021-03-16 DIAGNOSIS — C773 Secondary and unspecified malignant neoplasm of axilla and upper limb lymph nodes: Secondary | ICD-10-CM | POA: Diagnosis not present

## 2021-03-18 ENCOUNTER — Telehealth: Payer: Self-pay | Admitting: Hematology and Oncology

## 2021-03-18 NOTE — Telephone Encounter (Signed)
Spoke to patient to confirm afternoon clinic appointment for 8/17, packet will be mailed to patient

## 2021-03-19 ENCOUNTER — Encounter: Payer: Self-pay | Admitting: *Deleted

## 2021-03-23 ENCOUNTER — Other Ambulatory Visit: Payer: Self-pay | Admitting: *Deleted

## 2021-03-23 DIAGNOSIS — C50412 Malignant neoplasm of upper-outer quadrant of left female breast: Secondary | ICD-10-CM | POA: Insufficient documentation

## 2021-03-23 DIAGNOSIS — Z170 Estrogen receptor positive status [ER+]: Secondary | ICD-10-CM | POA: Insufficient documentation

## 2021-03-23 DIAGNOSIS — D485 Neoplasm of uncertain behavior of skin: Secondary | ICD-10-CM | POA: Diagnosis not present

## 2021-03-24 ENCOUNTER — Other Ambulatory Visit: Payer: 59

## 2021-03-24 NOTE — Progress Notes (Signed)
[place] NOTE  Patient Care Team: Orpah Melter, MD as PCP - General (Family Medicine) Mauro Kaufmann, RN as Oncology Nurse Navigator Rockwell Germany, RN as Oncology Nurse Navigator Jovita Kussmaul, MD as Consulting Physician (General Surgery) Nicholas Lose, MD as Consulting Physician (Hematology and Oncology) Eppie Gibson, MD as Attending Physician (Radiation Oncology)  CHIEF COMPLAINTS/PURPOSE OF CONSULTATION:  Newly diagnosed left breast cancer  HISTORY OF PRESENTING ILLNESS:  Christy Strickland 70 y.o. female is here because of recent diagnosis of invasive ductal carcinoma of the left breast. Screening mammogram on 03/04/21 showed a possible mass and abnormal lymph node in the left breast, and no findings of malignancy in the right breast. Diagnostic mammogram and [place] on 03/12/21 showed suspicious 1.1 cm mass at the left breast 2 o'clock position and enlarged left axillary lymph node. 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 She presents to the clinic today for initial evaluation and discussion of treatment options.   I reviewed her records extensively and collaborated the history with the patient.  SUMMARY OF ONCOLOGIC HISTORY: Oncology History  Malignant neoplasm of upper-outer quadrant of left breast in female, estrogen receptor positive ([place])  03/23/2021 Initial Diagnosis   Screening mammogram: a possible mass and abnormal lymph node in the left breast. Diagnostic mammogram and [place]: suspicious 1.1 cm mass at the left breast 2 o'clock position and enlarged left axillary lymph node. 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  ER 70%, PR 40%, Ki-67 15%, HER2 negative by [facility]   03/25/2021 Cancer Staging   Staging form: Breast, AJCC 8th Edition - Clinical stage from 03/25/2021: Stage IB (cT1c, cN1, cM0, G2,  ER+, PR+, HER2-) - Signed by Nicholas Lose, MD on 03/25/2021 Stage prefix: Initial diagnosis Histologic grading system: 3 grade system     MEDICAL HISTORY:  Past Medical History:  Diagnosis Date   Breast cancer ([place])    Hypertension    Neuromuscular disorder ([place])    neuropathy in feet   Seasonal allergies    Sleep apnea    uses CPAP nightly    SURGICAL HISTORY: Past Surgical History:  Procedure Laterality Date   BACK SURGERY     lower back   bilarteral knee replacements Bilateral    CESAREAN SECTION     x 2   COLON SURGERY     DIAGNOSTIC LAPAROSCOPY     benign mass   DILATATION & CURETTAGE/HYSTEROSCOPY WITH MYOSURE  04/27/2018   Procedure: DILATATION & CURETTAGE/HYSTEROSCOPY WITH MYOSURE;  Surgeon: Janyth Pupa, DO;  Location: [facility];  Service: Gynecology;;   DILATION AND CURETTAGE OF UTERUS     x 2    remove pituitary gland      SOCIAL HISTORY: Social History   Socioeconomic History   Marital status: Married    Spouse name: Jeneen Rinks   Number of children: 2   Years of education: Not on file   Highest education level: Not on file  Occupational History   Not on file  Tobacco Use   Smoking status: Never   Smokeless tobacco: Never  Vaping Use   Vaping Use: Never used  Substance and Sexual Activity   Alcohol use: Yes    Comment: occasional   Drug use: Never   Sexual activity: Not on file  Other Topics Concern   Not on file  Social History Narrative   Not  on file   Social Determinants of Health   Financial Resource Strain: Not on file  Food Insecurity: Not on file  Transportation Needs: Not on file  Physical Activity: Not on file  Stress: Not on file  Social Connections: Not on file  Intimate Partner Violence: Not on file    FAMILY HISTORY: History reviewed. No pertinent family history.  ALLERGIES:  is allergic to mushroom extract complex, amoxicillin-pot clavulanate, and meperidine hcl.  MEDICATIONS:  Current Outpatient Medications  Medication  Sig Dispense Refill   cetirizine (ZYRTEC) 10 MG tablet Take 10 mg by mouth daily.     hydrochlorothiazide (HYDRODIURIL) 25 MG tablet Take 25 mg by mouth daily.     Multiple Vitamin (MULTIVITAMIN WITH MINERALS) TABS tablet Take 1 tablet by mouth daily.     naproxen (NAPROSYN) 500 MG tablet Take 500 mg by mouth as needed.     vitamin C (ASCORBIC ACID) 250 MG tablet Take 250 mg by mouth daily.     No current facility-administered medications for this visit.    REVIEW OF SYSTEMS:   Constitutional: Denies fevers, chills or abnormal night sweats Eyes: Denies blurriness of vision, double vision or watery eyes Ears, nose, mouth, throat, and face: Denies mucositis or sore throat Respiratory: Denies cough, dyspnea or wheezes Cardiovascular: Denies palpitation, chest discomfort or lower extremity swelling Gastrointestinal:  Denies nausea, heartburn or change in bowel habits Skin: Denies abnormal skin rashes Lymphatics: Denies new lymphadenopathy or easy bruising Neurological:Denies numbness, tingling or new weaknesses Behavioral/Psych: Mood is stable, no new changes  Breast:  Denies any palpable lumps or discharge All other systems were reviewed with the patient and are negative.  PHYSICAL EXAMINATION: ECOG PERFORMANCE STATUS: 1 - Symptomatic but completely ambulatory  Vitals:   03/25/21 1300  BP: (!) 173/85  Pulse: 62  Resp: 20  Temp: 98.2 F (36.8 C)  SpO2: 98%   Filed Weights   03/25/21 1300  Weight: 267 lb 6.4 oz (121.3 kg)    LABORATORY DATA:  I have reviewed the data as listed Lab Results  Component Value Date   WBC 7.1 03/25/2021   HGB 14.6 03/25/2021   HCT 42.0 03/25/2021   MCV 88.6 03/25/2021   PLT 341 03/25/2021   Lab Results  Component Value Date   NA 140 03/25/2021   K 3.3 (L) 03/25/2021   CL 102 03/25/2021   CO2 27 03/25/2021    RADIOGRAPHIC STUDIES: I have personally reviewed the radiological reports and agreed with the findings in the  report.  ASSESSMENT AND PLAN:  Malignant neoplasm of upper-outer quadrant of left breast in female, estrogen receptor positive ([place]) 03/23/2021:Screening mammogram: a possible mass and abnormal lymph node in the left breast. Diagnostic mammogram and [place]: suspicious 1.1 cm mass at the left breast 2 o'clock position and enlarged left axillary lymph node. 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  ER 70%, PR 40%, Ki-67 15%, HER2 negative by [facility]  Pathology and radiology counseling:Discussed with the patient, the details of pathology including the type of breast cancer,the clinical staging, the significance of ER, PR and HER-2/neu receptors and the implications for treatment. After reviewing the pathology in detail, we proceeded to discuss the different treatment options between surgery, radiation, chemotherapy, antiestrogen therapies.  Recommendations: 1. Breast conserving surgery with targeted node dissection followed by 2. MammaPrint testing to determine if chemotherapy would be of any benefit followed by 3. Adjuvant radiation therapy followed by 4. Adjuvant antiestrogen therapy  Mammaprint counseling: MINDACT is a prospective, randomized phase III controlled trial that investigates the clinical utility of MammaPrint, when compared to standard clinical pathological criteria, with [id number],693 patients enrolled from over 111 institutions. Clinical high-risk patients with a Low Risk MammaPrint result, including 48% node-positive, had 5-year distant metastasis-free survival rate in excess of 94 percent, whether randomized to receive adjuvant chemotherapy or not proving MammaPrint's ability to safely identify Low Risk patients.  Return to clinic after surgery to discuss final pathology report and then determine if MammaPrint testing will need to be sent.    All questions were answered. The patient knows to call the clinic with any problems, questions or  concerns.   Rulon Eisenmenger, MD, MPH 03/25/2021    I, Thana Ates, am acting as scribe for Nicholas Lose, MD.  I have reviewed the above documentation for accuracy and completeness, and I agree with the above.

## 2021-03-24 NOTE — Progress Notes (Signed)
Radiation Oncology         [phone] ________________________________  Initial Outpatient Consultation  Name: Christy Strickland MRN: [id number]  Date: 03/25/2021  DOB: 06-Aug-1951  SF:KCLEXN, Annie Main, MD  Jovita Kussmaul, MD   REFERRING PHYSICIAN: Autumn Messing III, MD  DIAGNOSIS:    ICD-10-CM   1. Malignant neoplasm of upper-outer quadrant of left breast in female, estrogen receptor positive ([place])  C50.412    Z17.0      Cancer Staging Malignant neoplasm of upper-outer quadrant of left breast in female, estrogen receptor positive ([place]) Staging form: Breast, AJCC 8th Edition - Clinical stage from 03/25/2021: Stage IB (cT1c, cN1, cM0, G2, ER+, PR+, HER2-) - Signed by Nicholas Lose, MD on 03/25/2021 Stage prefix: Initial diagnosis Histologic grading system: 3 grade system  CHIEF COMPLAINT: Here to discuss management of left breast cancer  HISTORY OF PRESENT ILLNESS::Christy Strickland is a 70 y.o. female who presented with possible breast abnormality on the following imaging: bilateral screening mammogram on the date of 03/04/21. Diagnostic left mammogram and ultrasound on the date of 03/12/21 further revealed a suspicious 1.1 cm mass at the left breast; 2 o'clock position, as well as a suspicious enlarged left axillary lymph node.  No symptoms, if any, were reported at that time.     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  ER status: 70%, positive, with moderate staining intensity; PR status: 40%, positive, with with strong staining intensity, Her2 status: negative; Ki67: 15%; Grade 2.  She is here with her significant other today who appears quite supportive.  PREVIOUS RADIATION THERAPY: No  PAST MEDICAL HISTORY:  has a past medical history of Breast cancer ([place]),  Hypertension, Neuromuscular disorder ([place]), Seasonal allergies, and Sleep apnea.    PAST SURGICAL HISTORY: Past Surgical History:  Procedure Laterality Date   BACK SURGERY     lower back   bilarteral knee replacements Bilateral    CESAREAN SECTION     x 2   COLON SURGERY     DIAGNOSTIC LAPAROSCOPY     benign mass   DILATATION & CURETTAGE/HYSTEROSCOPY WITH MYOSURE  04/27/2018   Procedure: DILATATION & CURETTAGE/HYSTEROSCOPY WITH MYOSURE;  Surgeon: Janyth Pupa, DO;  Location: [facility];  Service: Gynecology;;   DILATION AND CURETTAGE OF UTERUS     x 2    remove pituitary gland      FAMILY HISTORY: family history is not on file.  SOCIAL HISTORY:  reports that she has never smoked. She has never used smokeless tobacco. She reports current alcohol use. She reports that she does not use drugs.  ALLERGIES: Mushroom extract complex, Amoxicillin-pot clavulanate, and Meperidine hcl  MEDICATIONS:  Current Outpatient Medications  Medication Sig Dispense Refill   cetirizine (ZYRTEC) 10 MG tablet Take 10 mg by mouth daily.     hydrochlorothiazide (HYDRODIURIL) 25 MG tablet Take 25 mg by mouth daily.     Multiple Vitamin (MULTIVITAMIN WITH MINERALS) TABS tablet Take 1 tablet by mouth daily.     naproxen (NAPROSYN) 500 MG tablet Take 500 mg by mouth as needed.     vitamin C (ASCORBIC ACID) 250 MG tablet Take 250 mg by mouth daily.     No current facility-administered medications for this encounter.    REVIEW OF SYSTEMS: As above   PHYSICAL EXAM:  vitals were not taken for this visit.   General: Alert and oriented, in no acute distress HEENT: Head is normocephalic. Extraocular movements are intact. Oropharynx is clear. Heart: Regular in rate and rhythm with no murmurs, rubs, or gallops. Chest: Clear to auscultation bilaterally, with no rhonchi, wheezes, or rales. Psychiatric: Judgment and insight are intact. Affect is appropriate. Breasts: There is some postbiopsy thickening and bruising  in the upper outer quadrant of the left breast. No other palpable masses appreciated in the breasts or axillae bilaterally.    ECOG = 1  0 - Asymptomatic (Fully active, able to carry on all predisease activities without restriction)  1 - Symptomatic but completely ambulatory (Restricted in physically strenuous activity but ambulatory and able to carry out work of a light or sedentary nature. For example, light housework, office work)  2 - Symptomatic, <50% in bed during the day (Ambulatory and capable of all self care but unable to carry out any work activities. Up and about more than 50% of waking hours)  3 - Symptomatic, >50% in bed, but not bedbound (Capable of only limited self-care, confined to bed or chair 50% or more of waking hours)  4 - Bedbound (Completely disabled. Cannot carry on any self-care. Totally confined to bed or chair)  5 - Death   Eustace Pen MM, Creech RH, Tormey DC, et al. [phone]). "Toxicity and response criteria of the [facility]". [place] Oncol. 5 (6): 649-55   LABORATORY DATA:  Lab Results  Component Value Date   WBC 7.1 03/25/2021   HGB 14.6 03/25/2021   HCT 42.0 03/25/2021   MCV 88.6 03/25/2021   PLT 341 03/25/2021   CMP     Component Value Date/Time   NA 140 03/25/2021 1250   K 3.3 (L) 03/25/2021 1250   CL 102 03/25/2021 1250   CO2 27 03/25/2021 1250   GLUCOSE 91 03/25/2021 1250   BUN 14 03/25/2021 1250   CREATININE 1.08 (H) 03/25/2021 1250   CALCIUM 10.2 03/25/2021 1250   PROT 7.8 03/25/2021 1250   ALBUMIN 3.7 03/25/2021 1250   AST 31 03/25/2021 1250   ALT 25 03/25/2021 1250   ALKPHOS 61 03/25/2021 1250   BILITOT 0.6 03/25/2021 1250   GFRNONAA 55 (L) 03/25/2021 1250   GFRAA >60 04/24/2018 1030         RADIOGRAPHY: US BREAST LTD UNI LEFT INC AXILLA  Result Date: 03/12/2021 CLINICAL DATA:  Screening recall for left breast mass and left axillary lymph node. EXAM: DIGITAL DIAGNOSTIC UNILATERAL LEFT MAMMOGRAM WITH  TOMOSYNTHESIS AND CAD; ULTRASOUND LEFT BREAST LIMITED TECHNIQUE: Left digital diagnostic mammography and breast tomosynthesis was performed. The images were evaluated with computer-aided detection.; Targeted ultrasound examination of the left breast was performed. COMPARISON:  Previous exam(s). ACR Breast Density Category a: The breast tissue is almost entirely fatty. FINDINGS: The previously described possible mass in left upper outer breast persists as a 1.2 cm irregular mass with associated architectural distortion. Targeted ultrasound of the left breast 2 o'clock position 5 cm from the nipple demonstrates a 1.1 x 0.7 x 0.7 cm irregular mass with angular and indistinct margins, corresponding to mammographic finding. The previously described enlarged left axillary lymph node persists on additional views, measuring up to 1.1 cm. Targeted ultrasound the left axilla demonstrates a lymph node with diffuse cortical thickening measuring 0.5 cm, corresponding with the mammographic finding. Targeted ultrasound of the left breast at the 8 o'clock position 2 cm from the nipple demonstrates a cluster of  microcysts spanning 1.0 cm, mammographically stable dating back to 06/02/2017. IMPRESSION: 1. Suspicious 1.1 cm mass at the left breast 2 o'clock position. Recommend ultrasound-guided biopsy. 2. Suspicious enlarged left axillary lymph node. Recommend ultrasound-guided biopsy. RECOMMENDATION: Ultrasound-guided biopsy of left breast mass and left axillary lymph node. Biopsies are scheduled for 03/25/2021. I have discussed the findings and recommendations with the patient. If applicable, a reminder letter will be sent to the patient regarding the next appointment. BI-RADS CATEGORY  5: Highly suggestive of malignancy. Electronically Signed   By: Ileana Roup MD   On: 03/12/2021 16:41  MM DIAG BREAST TOMO UNI LEFT  Result Date: 03/12/2021 CLINICAL DATA:  Screening recall for left breast mass and left axillary lymph node. EXAM:  DIGITAL DIAGNOSTIC UNILATERAL LEFT MAMMOGRAM WITH TOMOSYNTHESIS AND CAD; ULTRASOUND LEFT BREAST LIMITED TECHNIQUE: Left digital diagnostic mammography and breast tomosynthesis was performed. The images were evaluated with computer-aided detection.; Targeted ultrasound examination of the left breast was performed. COMPARISON:  Previous exam(s). ACR Breast Density Category a: The breast tissue is almost entirely fatty. FINDINGS: The previously described possible mass in left upper outer breast persists as a 1.2 cm irregular mass with associated architectural distortion. Targeted ultrasound of the left breast 2 o'clock position 5 cm from the nipple demonstrates a 1.1 x 0.7 x 0.7 cm irregular mass with angular and indistinct margins, corresponding to mammographic finding. The previously described enlarged left axillary lymph node persists on additional views, measuring up to 1.1 cm. Targeted ultrasound the left axilla demonstrates a lymph node with diffuse cortical thickening measuring 0.5 cm, corresponding with the mammographic finding. Targeted ultrasound of the left breast at the 8 o'clock position 2 cm from the nipple demonstrates a cluster of microcysts spanning 1.0 cm, mammographically stable dating back to 06/02/2017. IMPRESSION: 1. Suspicious 1.1 cm mass at the left breast 2 o'clock position. Recommend ultrasound-guided biopsy. 2. Suspicious enlarged left axillary lymph node. Recommend ultrasound-guided biopsy. RECOMMENDATION: Ultrasound-guided biopsy of left breast mass and left axillary lymph node. Biopsies are scheduled for 03/25/2021. I have discussed the findings and recommendations with the patient. If applicable, a reminder letter will be sent to the patient regarding the next appointment. BI-RADS CATEGORY  5: Highly suggestive of malignancy. Electronically Signed   By: Ileana Roup MD   On: 03/12/2021 16:41  MM 3D SCREEN BREAST BILATERAL  Result Date: 03/05/2021 CLINICAL DATA:  Screening. EXAM:  DIGITAL SCREENING BILATERAL MAMMOGRAM WITH TOMOSYNTHESIS AND CAD TECHNIQUE: Bilateral screening digital craniocaudal and mediolateral oblique mammograms were obtained. Bilateral screening digital breast tomosynthesis was performed. The images were evaluated with computer-aided detection. COMPARISON:  Previous exam(s). ACR Breast Density Category a: The breast tissue is almost entirely fatty. FINDINGS: In the left breast, a possible mass and possible abnormal lymph node warrant further evaluation. In the right breast, no findings suspicious for malignancy. IMPRESSION: Further evaluation is suggested for a possible mass and possible abnormal lymph node in the left breast/axilla. RECOMMENDATION: Diagnostic mammogram and possibly ultrasound of the left breast. (Code:[id number]) The patient will be contacted regarding the findings, and additional imaging will be scheduled. BI-RADS CATEGORY  0: Incomplete. Need additional imaging evaluation and/or prior mammograms for comparison. Electronically Signed   By: Lajean Manes M.D.   On: 03/05/2021 11:42   [place] AXILLARY NODE CORE BIOPSY LEFT  Addendum Date: 03/18/2021   ADDENDUM REPORT: 03/17/2021 14:34 ADDENDUM: Pathology revealed GRADE II INVASIVE DUCTAL CARCINOMA, DUCTAL CARCINOMA IN SITU of the Left breast, 2:00 o'clock, (ribbon clip). This was found to be concordant  by Dr. Dorise Bullion. Pathology revealed METASTATIC CARCINOMA IN A LYMPH NODE of the Left axilla, (tribell clip). This was found to be concordant by Dr. Dorise Bullion. Pathology results were discussed with the patient by telephone. The patient reported doing well after the biopsies with tenderness at the sites. Post biopsy instructions and care were reviewed and questions were answered. The patient was encouraged to call [facility] for any additional concerns. My direct phone number was provided. The patient was referred to [facility] on March 25, 2021. Pathology results reported by Terie Purser, RN on 03/17/2021. Electronically Signed   By: Dorise Bullion III M.D   On: 03/17/2021 14:34   Result Date: 03/18/2021 CLINICAL DATA:  Biopsy of a left breast mass at 2 o'clock, 5 cm from the nipple and a left axillary lymph node. EXAM: ULTRASOUND GUIDED LEFT BREAST CORE NEEDLE BIOPSY COMPARISON:  Previous exam(s). PROCEDURE: I met with the patient and we discussed the procedure of ultrasound-guided biopsy, including benefits and alternatives. We discussed the high likelihood of a successful procedure. We discussed the risks of the procedure, including infection, bleeding, tissue injury, clip migration, and inadequate sampling. Informed written consent was given. The usual time-out protocol was performed immediately prior to the procedure. Lesion quadrant: Left breast mass at 2 o'clock Using sterile technique and 1% Lidocaine as local anesthetic, under direct ultrasound visualization, a 12 gauge spring-loaded device was used to perform biopsy of a 2 o'clock left breast mass using a lateral approach. At the conclusion of the procedure a ribbon shaped tissue marker clip was deployed into the biopsy cavity. Follow up 2 view mammogram was performed and dictated separately. Lesion quadrant: Left axillary lymph node Using sterile technique and 1% Lidocaine as local anesthetic, under direct ultrasound visualization, a 14 gauge spring-loaded device was used to perform biopsy of a left axillary lymph node using a lateral approach. At the conclusion of the procedure a tri bell tissue marker clip was deployed into the biopsy cavity. Follow up 2 view mammogram was performed and dictated separately. IMPRESSION: Ultrasound guided biopsy of a 2 o'clock left breast mass and a left axillary lymph node. No apparent complications. Electronically Signed: By: Dorise Bullion III M.D On: 03/16/2021 10:27  MM CLIP PLACEMENT LEFT  Result Date:  03/16/2021 CLINICAL DATA:  Evaluate biopsy markers EXAM: 3D DIAGNOSTIC LEFT MAMMOGRAM POST ULTRASOUND BIOPSY COMPARISON:  Previous exam(s). FINDINGS: 3D Mammographic images were obtained following ultrasound guided biopsy of a 2 o'clock left breast mass and an abnormal left axillary lymph node. The biopsy marking clips are in expected positions at the sites of the biopsies. IMPRESSION: Appropriate positioning of the ribbon shaped clip in the biopsied 2 o'clock left breast mass and a tri bell shaped clip within the biopsied axillary lymph node. Final Assessment: Post Procedure Mammograms for Marker Placement Electronically Signed   By: Dorise Bullion III M.D   On: 03/16/2021 10:35  [place] LT BREAST BX W LOC DEV 1ST LESION IMG BX SPEC US GUIDE  Addendum Date: 03/18/2021   ADDENDUM REPORT: 03/17/2021 14:34 ADDENDUM: Pathology revealed GRADE II INVASIVE DUCTAL CARCINOMA, DUCTAL CARCINOMA IN SITU of the Left breast, 2:00 o'clock, (ribbon clip). This was found to be concordant by Dr. Dorise Bullion. Pathology revealed METASTATIC CARCINOMA IN A LYMPH NODE of the Left axilla, (tribell clip). This was found to be concordant by Dr. Dorise Bullion. Pathology results were discussed with the patient  by telephone. The patient reported doing well after the biopsies with tenderness at the sites. Post biopsy instructions and care were reviewed and questions were answered. The patient was encouraged to call [facility] for any additional concerns. My direct phone number was provided. The patient was referred to [facility] at [facility] on March 25, 2021. Pathology results reported by Terie Purser, RN on 03/17/2021. Electronically Signed   By: Dorise Bullion III M.D   On: 03/17/2021 14:34   Result Date: 03/18/2021 CLINICAL DATA:  Biopsy of a left breast mass at 2 o'clock, 5 cm from the nipple and a left axillary lymph node. EXAM: ULTRASOUND  GUIDED LEFT BREAST CORE NEEDLE BIOPSY COMPARISON:  Previous exam(s). PROCEDURE: I met with the patient and we discussed the procedure of ultrasound-guided biopsy, including benefits and alternatives. We discussed the high likelihood of a successful procedure. We discussed the risks of the procedure, including infection, bleeding, tissue injury, clip migration, and inadequate sampling. Informed written consent was given. The usual time-out protocol was performed immediately prior to the procedure. Lesion quadrant: Left breast mass at 2 o'clock Using sterile technique and 1% Lidocaine as local anesthetic, under direct ultrasound visualization, a 12 gauge spring-loaded device was used to perform biopsy of a 2 o'clock left breast mass using a lateral approach. At the conclusion of the procedure a ribbon shaped tissue marker clip was deployed into the biopsy cavity. Follow up 2 view mammogram was performed and dictated separately. Lesion quadrant: Left axillary lymph node Using sterile technique and 1% Lidocaine as local anesthetic, under direct ultrasound visualization, a 14 gauge spring-loaded device was used to perform biopsy of a left axillary lymph node using a lateral approach. At the conclusion of the procedure a tri bell tissue marker clip was deployed into the biopsy cavity. Follow up 2 view mammogram was performed and dictated separately. IMPRESSION: Ultrasound guided biopsy of a 2 o'clock left breast mass and a left axillary lymph node. No apparent complications. Electronically Signed: By: Dorise Bullion III M.D On: 03/16/2021 10:27     IMPRESSION/PLAN: Left breast cancer, node positive, ER 70% moderately positive, PR 40% strongly positive, HER2 negative  She is a good candidate for breast conserving surgery based on her discussion at tumor board.  She is enthusiastic about preserving her breast.  It was a pleasure meeting the patient today. We discussed the risks, benefits, and side effects of  postoperative radiotherapy. I recommend radiotherapy to the left breast and regional nodes to reduce her risk of locoregional recurrence by 2/3.  We discussed that radiation would take approximately 6 weeks to complete and that I would give the patient a few weeks to heal following surgery before starting treatment planning.  If chemotherapy were to be given, this would precede radiotherapy (MammaPrint test is pending). We spoke about acute effects including skin irritation and fatigue as well as much less common late effects including internal organ injury or irritation. We spoke about the latest technology that is used to minimize the risk of late effects for patients undergoing radiotherapy to the breast or chest wall. No guarantees of treatment were given. The patient is enthusiastic about proceeding with treatment. I look forward to participating in the patient's care.  I will await her referral back to me for postoperative follow-up and eventual CT simulation/treatment planning.  On date of service, in total, I spent 45 minutes on this encounter. Patient was seen in  person.   __________________________________________   Eppie Gibson, MD  This document serves as a record of services personally performed by Eppie Gibson, MD. It was created on her behalf by Roney Mans, a trained medical scribe. The creation of this record is based on the scribe's personal observations and the provider's statements to them. This document has been checked and approved by the attending provider.

## 2021-03-25 ENCOUNTER — Other Ambulatory Visit: Payer: Self-pay

## 2021-03-25 ENCOUNTER — Other Ambulatory Visit: Payer: 59

## 2021-03-25 ENCOUNTER — Ambulatory Visit: Payer: Medicare Other | Attending: General Surgery | Admitting: Physical Therapy

## 2021-03-25 ENCOUNTER — Inpatient Hospital Stay: Payer: Medicare Other

## 2021-03-25 ENCOUNTER — Encounter: Payer: Self-pay | Admitting: General Practice

## 2021-03-25 ENCOUNTER — Inpatient Hospital Stay (HOSPITAL_BASED_OUTPATIENT_CLINIC_OR_DEPARTMENT_OTHER): Payer: Medicare Other | Admitting: Hematology and Oncology

## 2021-03-25 ENCOUNTER — Encounter: Payer: Self-pay | Admitting: Physical Therapy

## 2021-03-25 ENCOUNTER — Ambulatory Visit
Admission: RE | Admit: 2021-03-25 | Discharge: 2021-03-25 | Disposition: A | Discharge status: Home / Self Care | Payer: Medicare Other | Source: Ambulatory Visit | Attending: Radiation Oncology | Admitting: Radiation Oncology

## 2021-03-25 ENCOUNTER — Encounter: Payer: Self-pay | Admitting: Radiation Oncology

## 2021-03-25 ENCOUNTER — Encounter: Payer: Self-pay | Admitting: Hematology and Oncology

## 2021-03-25 ENCOUNTER — Ambulatory Visit: Payer: Self-pay | Admitting: General Surgery

## 2021-03-25 DIAGNOSIS — C50412 Malignant neoplasm of upper-outer quadrant of left female breast: Secondary | ICD-10-CM

## 2021-03-25 DIAGNOSIS — C773 Secondary and unspecified malignant neoplasm of axilla and upper limb lymph nodes: Secondary | ICD-10-CM

## 2021-03-25 DIAGNOSIS — R293 Abnormal posture: Secondary | ICD-10-CM | POA: Insufficient documentation

## 2021-03-25 DIAGNOSIS — Z79899 Other long term (current) drug therapy: Secondary | ICD-10-CM

## 2021-03-25 DIAGNOSIS — Z880 Allergy status to penicillin: Secondary | ICD-10-CM | POA: Insufficient documentation

## 2021-03-25 DIAGNOSIS — Z170 Estrogen receptor positive status [ER+]: Secondary | ICD-10-CM

## 2021-03-25 LAB — CMP (CANCER CENTER ONLY)
ALT: 25 U/L (ref 0–44)
AST: 31 U/L (ref 15–41)
Albumin: 3.7 g/dL (ref 3.5–5.0)
Alkaline Phosphatase: 61 U/L (ref 38–126)
Anion gap: 11 (ref 5–15)
BUN: 14 mg/dL (ref 8–23)
CO2: 27 mmol/L (ref 22–32)
Calcium: 10.2 mg/dL (ref 8.9–10.3)
Chloride: 102 mmol/L (ref 98–111)
Creatinine: 1.08 mg/dL — ABNORMAL HIGH (ref 0.44–1.00)
GFR, Estimated: 55 mL/min — ABNORMAL LOW (ref >60)
Glucose, Bld: 91 mg/dL (ref 70–99)
Potassium: 3.3 mmol/L — ABNORMAL LOW (ref 3.5–5.1)
Sodium: 140 mmol/L (ref 135–145)
Total Bilirubin: 0.6 mg/dL (ref 0.3–1.2)
Total Protein: 7.8 g/dL (ref 6.5–8.1)

## 2021-03-25 LAB — CBC WITH DIFFERENTIAL (CANCER CENTER ONLY)
Abs Immature Granulocytes: 0 10*3/uL (ref 0.00–0.07)
Basophils Absolute: 0 10*3/uL (ref 0.0–0.1)
Basophils Relative: 0 %
Eosinophils Absolute: 0.1 10*3/uL (ref 0.0–0.5)
Eosinophils Relative: 1 %
HCT: 42 % (ref 36.0–46.0)
Hemoglobin: 14.6 g/dL (ref 12.0–15.0)
Immature Granulocytes: 0 %
Lymphocytes Relative: 26 %
Lymphs Abs: 1.8 10*3/uL (ref 0.7–4.0)
MCH: 30.8 pg (ref 26.0–34.0)
MCHC: 34.8 g/dL (ref 30.0–36.0)
MCV: 88.6 fL (ref 80.0–100.0)
Monocytes Absolute: 0.6 10*3/uL (ref 0.1–1.0)
Monocytes Relative: 8 %
Neutro Abs: 4.5 10*3/uL (ref 1.7–7.7)
Neutrophils Relative %: 65 %
Platelet Count: 341 10*3/uL (ref 150–400)
RBC: 4.74 MIL/uL (ref 3.87–5.11)
RDW: 12.8 % (ref 11.5–15.5)
WBC Count: 7.1 10*3/uL (ref 4.0–10.5)
nRBC: 0 % (ref 0.0–0.2)

## 2021-03-25 LAB — GENETIC SCREENING ORDER

## 2021-03-25 NOTE — Therapy (Signed)
[place], [place], [address] Phone: [phone]   Fax:  [phone]  Physical Therapy Evaluation  Patient Details  Name: Christy Strickland MRN: [id number] Date of Birth: Apr 29, 1951 Referring Provider (PT): Dr. Autumn Messing   Encounter Date: 03/25/2021   PT End of Session - 03/25/21 1602     Visit Number 1    Number of Visits 2    Date for PT Re-Evaluation 05/20/21    PT Start Time 1309    PT Stop Time 1322   Aalso saw pt from 1345-1403 for a total of 31 min   PT Time Calculation (min) 13 min    Activity Tolerance Patient tolerated treatment well    Behavior During Therapy [facility] for tasks assessed/performed             Past Medical History:  Diagnosis Date   Breast cancer ([place])    Hypertension    Neuromuscular disorder ([place])    neuropathy in feet   Seasonal allergies    Sleep apnea    uses CPAP nightly    Past Surgical History:  Procedure Laterality Date   BACK SURGERY     lower back   bilarteral knee replacements Bilateral    CESAREAN SECTION     x 2   COLON SURGERY     DIAGNOSTIC LAPAROSCOPY     benign mass   DILATATION & CURETTAGE/HYSTEROSCOPY WITH MYOSURE  04/27/2018   Procedure: DILATATION & CURETTAGE/HYSTEROSCOPY WITH MYOSURE;  Surgeon: Janyth Pupa, DO;  Location: [facility];  Service: Gynecology;;   DILATION AND CURETTAGE OF UTERUS     x 2    remove pituitary gland      There were no vitals filed for this visit.    Subjective Assessment - 03/25/21 1557     Subjective Patient reports she is here today to be seen by her medical team for her newly diagnosed left breast cancer.    Patient is accompained by: Family member    Pertinent History Patient was diagnosed on 03/04/2021 with left grade II invasive ductal carcinoma breast cancer. It measures 1.1 cm and is located in the upper outer quadrant. It is ER/PR positive and HER2 negative with a Ki67 of 15%. She has baseline  bilateral feet neuropathy. She has a positive axillary lymph node.    Patient Stated Goals Reduce lymphedema risk and learn post op shoulder ROM HEP    Currently in Pain? No/denies                [facility] PT Assessment - 03/25/21 0001       Assessment   Medical Diagnosis Left breast cancer    Referring Provider (PT) Dr. Autumn Messing    Onset Date/Surgical Date 03/04/21    Hand Dominance Right    Prior Therapy none      Precautions   Precautions Other (comment)    Precaution Comments active cancer      Restrictions   Weight Bearing Restrictions No      Balance Screen   Has the patient fallen in the past 6 months No    Has the patient had a decrease in activity level because of a fear of falling?  No    Is the patient reluctant to leave their home because of a fear of falling?  No      Home Social worker Private residence    Living Arrangements Spouse/significant other    Available Help  at Discharge Family      Prior Function   Level of [place] Retired    Leisure She walks 30 min/day at a slow pace per her report      Cognition   Overall Cognitive Status Within Functional Limits for tasks assessed      Posture/Postural Control   Posture/Postural Control Postural limitations    Postural Limitations Rounded Shoulders;Forward head      ROM / Strength   AROM / PROM / Strength AROM;Strength      AROM   Overall AROM Comments Cervical AROM is WNL    AROM Assessment Site Shoulder    Right/Left Shoulder Right;Left    Right Shoulder Extension 50 Degrees    Right Shoulder Flexion 153 Degrees    Right Shoulder ABduction 167 Degrees    Right Shoulder Internal Rotation 64 Degrees    Right Shoulder External Rotation 80 Degrees    Left Shoulder Extension 57 Degrees    Left Shoulder Flexion 153 Degrees    Left Shoulder ABduction 161 Degrees    Left Shoulder Internal Rotation 54 Degrees    Left Shoulder External Rotation 73  Degrees      Strength   Overall Strength Within functional limits for tasks performed               LYMPHEDEMA/ONCOLOGY QUESTIONNAIRE - 03/25/21 0001       Type   Cancer Type Left breast cancer      Lymphedema Assessments   Lymphedema Assessments Upper extremities      Right Upper Extremity Lymphedema   10 cm Proximal to Olecranon Process 33.5 cm    Olecranon Process 26.6 cm    10 cm Proximal to Ulnar Styloid Process 25.8 cm    Just Proximal to Ulnar Styloid Process 17.2 cm    Across Hand at [employer] 20.5 cm    At [place] of 2nd Digit 6.9 cm      Left Upper Extremity Lymphedema   10 cm Proximal to Olecranon Process 33.9 cm    Olecranon Process 25.9 cm    10 cm Proximal to Ulnar Styloid Process 23.6 cm    Just Proximal to Ulnar Styloid Process 16.7 cm    Across Hand at [employer] 19.8 cm    At [place] of 2nd Digit 6.4 cm             L-DEX FLOWSHEETS - 03/25/21 1600       L-DEX LYMPHEDEMA SCREENING   Measurement Type Unilateral    L-DEX MEASUREMENT EXTREMITY Upper Extremity    POSITION  Standing    DOMINANT SIDE Right    At Risk Side Left    BASELINE SCORE (UNILATERAL) -0.8                    Objective measurements completed on examination: See above findings.        Patient was instructed today in a home exercise program today for post op shoulder range of motion. These included active assist shoulder flexion in sitting, scapular retraction, wall walking with shoulder abduction, and hands behind head external rotation.  She was encouraged to do these twice a day, holding 3 seconds and repeating 5 times when permitted by her physician.         PT Education - 03/25/21 1602     Education Details Lymphedema risk reduction and post op HEP    Person(s) Educated Patient;Spouse    Methods  Explanation;Demonstration;Handout    Comprehension Returned demonstration;Verbalized understanding                 PT Long Term Goals -  03/25/21 1605       PT LONG TERM GOAL #1   Title Patient will demonstrate she has regained full shoulder ROM and function post operatively compared to baselines.    Time 8    Period Weeks    Status New    Target Date 05/20/21             Breast Clinic Goals - 03/25/21 1605       Patient will be able to verbalize understanding of pertinent lymphedema risk reduction practices relevant to her diagnosis specifically related to skin care.   Time 1    Period Days    Status Achieved      Patient will be able to return demonstrate and/or verbalize understanding of the post-op home exercise program related to regaining shoulder range of motion.   Time 1    Period Days    Status Achieved      Patient will be able to verbalize understanding of the importance of attending the postoperative After Breast Cancer Class for further lymphedema risk reduction education and therapeutic exercise.   Time 1    Period Days    Status Achieved                   Plan - 03/25/21 1603     Clinical Impression Statement Patient was diagnosed on 03/04/2021 with left grade II invasive ductal carcinoma breast cancer. It measures 1.1 cm and is located in the upper outer quadrant. It is ER/PR positive and HER2 negative with a Ki67 of 15%. She has a positive axillary lymph node.  She has baseline bilateral feet neuropathy. Her multidisciplinary medical team met prior to her assessments to determine a recommended treatment plan. She is planning to have a left lumpectomy and targeted axillary node dissection followed by radiation and anti-estrogen therapy. She will benefit from a post op PT reassessment to determine needs and from L-Dex screens every 3 months for 2 yeras to detect subclinical lymphedema.    Stability/Clinical Decision Making Stable/Uncomplicated    Clinical Decision Making Low    Rehab Potential Excellent    PT Frequency --   Eval and 1 f/u visit   PT Treatment/Interventions ADLs/Self  Care Home Management;Therapeutic exercise;Patient/family education    PT Next Visit Plan Will reassess 3-4 weeks post op    PT Home Exercise Plan Post op shoulder ROM HEP    Consulted and Agree with Plan of Care Patient;Family member/caregiver    Family Member Consulted husband             Patient will benefit from skilled therapeutic intervention in order to improve the following deficits and impairments:  Postural dysfunction, Decreased range of motion, Decreased knowledge of precautions, Impaired UE functional use, Pain  Visit Diagnosis: Malignant neoplasm of upper-outer quadrant of left breast in female, estrogen receptor positive ([place]) - Plan: PT plan of care cert/re-cert  Abnormal posture - Plan: PT plan of care cert/re-cert  Patient will follow up at outpatient cancer rehab 3-4 weeks following surgery.  If the patient requires physical therapy at that time, a specific plan will be dictated and sent to the referring physician for approval. The patient was educated today on appropriate basic range of motion exercises to begin post operatively and the importance of attending the After Breast Cancer  class following surgery.  Patient was educated today on lymphedema risk reduction practices as it pertains to recommendations that will benefit the patient immediately following surgery.  She verbalized good understanding.      Problem List Patient Active Problem List   Diagnosis Date Noted   Malignant neoplasm of upper-outer quadrant of left breast in female, estrogen receptor positive ([place]) 03/23/2021   Annia Friendly, PT 03/25/21 4:08 PM   [address], [place], [address] Phone: [phone]   Fax:  [phone]  Name: Maleny Candy MRN: [id number] Date of Birth: 31-Dec-1950

## 2021-03-25 NOTE — Assessment & Plan Note (Signed)
03/23/2021:Screening mammogram: a possible mass and abnormal lymph node in the left breast. Diagnostic mammogram and [place]: suspicious 1.1 cm mass at the left breast 2 o'clock position and enlarged left axillary lymph node. 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  ER 70%, PR 40%, Ki-67 15%, HER2 negative by [facility]  Pathology and radiology counseling:Discussed with the patient, the details of pathology including the type of breast cancer,the clinical staging, the significance of ER, PR and HER-2/neu receptors and the implications for treatment. After reviewing the pathology in detail, we proceeded to discuss the different treatment options between surgery, radiation, chemotherapy, antiestrogen therapies.  Recommendations: 1. Breast conserving surgery with targeted node dissection followed by 2. MammaPrint testing to determine if chemotherapy would be of any benefit followed by 3. Adjuvant radiation therapy followed by 4. Adjuvant antiestrogen therapy  Mammaprint counseling: MINDACT is a prospective, randomized phase III controlled trial that investigates the clinical utility of MammaPrint, when compared to standard clinical pathological criteria, with 6,693 patients enrolled from over 111 institutions. Clinical high-risk patients with a Low Risk MammaPrint result, including 48% node-positive, had 5-year distant metastasis-free survival rate in excess of 94 percent, whether randomized to receive adjuvant chemotherapy or not proving MammaPrint's ability to safely identify Low Risk patients.  Return to clinic after surgery to discuss final pathology report and then determine if MammaPrint testing will need to be sent.

## 2021-03-25 NOTE — Progress Notes (Signed)
Christy Strickland Psychosocial Distress Screening Spiritual Care  Met with Christy Strickland and her husband in [facility] to introduce [place] team/resources, reviewing distress screen per protocol.  The patient scored a 8 on the Psychosocial Distress Thermometer which indicates severe distress. Also assessed for distress and other psychosocial needs.   ONCBCN DISTRESS SCREENING 03/25/2021  Screening Type Initial Screening  Distress experienced in past week (1-10) 8  Emotional problem type Nervousness/Anxiety  Referral to support programs Yes   Christy Strickland notes that meeting her team and learning about the scope of her diagnosis and treatment plan reduced her distress to 5-6. She expects that she will sleep better tonight and reports good support from her close-knit family.   Follow up needed: No. Christy Strickland is aware of ongoing [place] team/programming availability and plans to reach out as needed/desired.   [place], [place], [facility] Pager [phone] Voicemail [phone]

## 2021-03-25 NOTE — Patient Instructions (Signed)

## 2021-03-27 ENCOUNTER — Other Ambulatory Visit: Payer: Self-pay | Admitting: General Surgery

## 2021-03-27 ENCOUNTER — Encounter: Payer: Self-pay | Admitting: *Deleted

## 2021-03-27 ENCOUNTER — Other Ambulatory Visit: Payer: Self-pay

## 2021-03-27 ENCOUNTER — Encounter (HOSPITAL_BASED_OUTPATIENT_CLINIC_OR_DEPARTMENT_OTHER): Payer: Self-pay | Admitting: General Surgery

## 2021-03-27 DIAGNOSIS — Z170 Estrogen receptor positive status [ER+]: Secondary | ICD-10-CM

## 2021-03-30 ENCOUNTER — Encounter (HOSPITAL_BASED_OUTPATIENT_CLINIC_OR_DEPARTMENT_OTHER)
Admission: RE | Admit: 2021-03-30 | Discharge: 2021-03-30 | Disposition: A | Discharge status: Home / Self Care | Payer: Medicare Other | Source: Ambulatory Visit | Attending: General Surgery | Admitting: General Surgery

## 2021-03-30 ENCOUNTER — Telehealth: Payer: Self-pay | Admitting: Hematology and Oncology

## 2021-03-30 DIAGNOSIS — Z01812 Encounter for preprocedural laboratory examination: Secondary | ICD-10-CM | POA: Insufficient documentation

## 2021-03-30 LAB — BASIC METABOLIC PANEL
Anion gap: 10 (ref 5–15)
BUN: 12 mg/dL (ref 8–23)
CO2: 27 mmol/L (ref 22–32)
Calcium: 10.2 mg/dL (ref 8.9–10.3)
Chloride: 103 mmol/L (ref 98–111)
Creatinine, Ser: 1.04 mg/dL — ABNORMAL HIGH (ref 0.44–1.00)
GFR, Estimated: 58 mL/min — ABNORMAL LOW (ref >60)
Glucose, Bld: 88 mg/dL (ref 70–99)
Potassium: 3.7 mmol/L (ref 3.5–5.1)
Sodium: 140 mmol/L (ref 135–145)

## 2021-03-30 NOTE — Telephone Encounter (Signed)
Scheduled appt per 8/19 sch msg. Pt's husband is aware.

## 2021-03-30 NOTE — Progress Notes (Signed)

## 2021-04-01 ENCOUNTER — Other Ambulatory Visit: Payer: Self-pay

## 2021-04-01 ENCOUNTER — Ambulatory Visit
Admission: RE | Admit: 2021-04-01 | Discharge: 2021-04-01 | Disposition: A | Discharge status: Home / Self Care | Payer: Medicare Other | Source: Ambulatory Visit | Attending: General Surgery | Admitting: General Surgery

## 2021-04-01 ENCOUNTER — Other Ambulatory Visit: Payer: Self-pay | Admitting: General Surgery

## 2021-04-01 DIAGNOSIS — Z170 Estrogen receptor positive status [ER+]: Secondary | ICD-10-CM

## 2021-04-01 DIAGNOSIS — C50412 Malignant neoplasm of upper-outer quadrant of left female breast: Secondary | ICD-10-CM

## 2021-04-01 DIAGNOSIS — R928 Other abnormal and inconclusive findings on diagnostic imaging of breast: Secondary | ICD-10-CM | POA: Diagnosis not present

## 2021-04-01 DIAGNOSIS — R590 Localized enlarged lymph nodes: Secondary | ICD-10-CM | POA: Diagnosis not present

## 2021-04-02 ENCOUNTER — Ambulatory Visit (HOSPITAL_BASED_OUTPATIENT_CLINIC_OR_DEPARTMENT_OTHER): Payer: Medicare Other | Admitting: Anesthesiology

## 2021-04-02 ENCOUNTER — Encounter (HOSPITAL_COMMUNITY)
Admission: RE | Admit: 2021-04-02 | Discharge: 2021-04-02 | Disposition: A | Discharge status: Home / Self Care | Payer: Medicare Other | Source: Ambulatory Visit | Attending: General Surgery | Admitting: General Surgery

## 2021-04-02 ENCOUNTER — Ambulatory Visit (HOSPITAL_BASED_OUTPATIENT_CLINIC_OR_DEPARTMENT_OTHER)
Admission: RE | Admit: 2021-04-02 | Discharge: 2021-04-02 | Disposition: A | Discharge status: Home / Self Care | Payer: Medicare Other | Attending: General Surgery | Admitting: General Surgery

## 2021-04-02 ENCOUNTER — Ambulatory Visit
Admission: RE | Admit: 2021-04-02 | Discharge: 2021-04-02 | Disposition: A | Discharge status: Home / Self Care | Payer: Medicare Other | Source: Ambulatory Visit | Attending: General Surgery | Admitting: General Surgery

## 2021-04-02 ENCOUNTER — Encounter (HOSPITAL_BASED_OUTPATIENT_CLINIC_OR_DEPARTMENT_OTHER): Payer: Self-pay | Admitting: General Surgery

## 2021-04-02 ENCOUNTER — Encounter (HOSPITAL_BASED_OUTPATIENT_CLINIC_OR_DEPARTMENT_OTHER)
Admission: RE | Disposition: A | Discharge status: Home / Self Care | Payer: Self-pay | Source: Home / Self Care | Attending: General Surgery

## 2021-04-02 ENCOUNTER — Other Ambulatory Visit: Payer: Self-pay

## 2021-04-02 ENCOUNTER — Encounter: Payer: Self-pay | Admitting: *Deleted

## 2021-04-02 DIAGNOSIS — Z170 Estrogen receptor positive status [ER+]: Secondary | ICD-10-CM

## 2021-04-02 DIAGNOSIS — Z8349 Family history of other endocrine, nutritional and metabolic diseases: Secondary | ICD-10-CM | POA: Insufficient documentation

## 2021-04-02 DIAGNOSIS — C50412 Malignant neoplasm of upper-outer quadrant of left female breast: Secondary | ICD-10-CM | POA: Diagnosis not present

## 2021-04-02 DIAGNOSIS — Z833 Family history of diabetes mellitus: Secondary | ICD-10-CM | POA: Insufficient documentation

## 2021-04-02 DIAGNOSIS — C773 Secondary and unspecified malignant neoplasm of axilla and upper limb lymph nodes: Secondary | ICD-10-CM | POA: Insufficient documentation

## 2021-04-02 DIAGNOSIS — Z880 Allergy status to penicillin: Secondary | ICD-10-CM | POA: Insufficient documentation

## 2021-04-02 DIAGNOSIS — Z8249 Family history of ischemic heart disease and other diseases of the circulatory system: Secondary | ICD-10-CM | POA: Diagnosis not present

## 2021-04-02 DIAGNOSIS — Z79899 Other long term (current) drug therapy: Secondary | ICD-10-CM | POA: Diagnosis not present

## 2021-04-02 DIAGNOSIS — Z888 Allergy status to other drugs, medicaments and biological substances status: Secondary | ICD-10-CM | POA: Insufficient documentation

## 2021-04-02 DIAGNOSIS — Z6841 Body Mass Index (BMI) 40.0 and over, adult: Secondary | ICD-10-CM | POA: Diagnosis not present

## 2021-04-02 DIAGNOSIS — C50912 Malignant neoplasm of unspecified site of left female breast: Secondary | ICD-10-CM | POA: Diagnosis not present

## 2021-04-02 DIAGNOSIS — R590 Localized enlarged lymph nodes: Secondary | ICD-10-CM | POA: Diagnosis not present

## 2021-04-02 DIAGNOSIS — Z823 Family history of stroke: Secondary | ICD-10-CM | POA: Diagnosis not present

## 2021-04-02 DIAGNOSIS — I10 Essential (primary) hypertension: Secondary | ICD-10-CM | POA: Diagnosis not present

## 2021-04-02 DIAGNOSIS — Z91018 Allergy to other foods: Secondary | ICD-10-CM | POA: Insufficient documentation

## 2021-04-02 DIAGNOSIS — R928 Other abnormal and inconclusive findings on diagnostic imaging of breast: Secondary | ICD-10-CM | POA: Diagnosis not present

## 2021-04-02 DIAGNOSIS — G8918 Other acute postprocedural pain: Secondary | ICD-10-CM | POA: Diagnosis not present

## 2021-04-02 HISTORY — PX: BREAST LUMPECTOMY WITH RADIOACTIVE SEED AND SENTINEL LYMPH NODE BIOPSY: SHX6550

## 2021-04-02 HISTORY — DX: Unspecified osteoarthritis, unspecified site: M19.90

## 2021-04-02 HISTORY — PX: RADIOACTIVE SEED GUIDED AXILLARY SENTINEL LYMPH NODE: SHX6735

## 2021-04-02 SURGERY — BREAST LUMPECTOMY WITH RADIOACTIVE SEED AND SENTINEL LYMPH NODE BIOPSY
Anesthesia: General | Site: Breast | Laterality: Left

## 2021-04-02 MED ORDER — ACETAMINOPHEN 160 MG/5ML PO SOLN: 325.0000 mg | ORAL | Status: DC | PRN

## 2021-04-02 MED ORDER — FENTANYL CITRATE (PF) 100 MCG/2ML IJ SOLN: 25.0000 ug | INTRAMUSCULAR | Status: DC | PRN

## 2021-04-02 MED ORDER — CELECOXIB 200 MG PO CAPS
ORAL_CAPSULE | ORAL | Status: AC
  Filled 2021-04-02: qty 1

## 2021-04-02 MED ORDER — GABAPENTIN 300 MG PO CAPS
300.0000 mg | ORAL_CAPSULE | ORAL | Status: AC
  Administered 2021-04-02: 300 mg via ORAL

## 2021-04-02 MED ORDER — BUPIVACAINE-EPINEPHRINE 0.25% -1:200000 IJ SOLN
INTRAMUSCULAR | Status: DC | PRN
  Administered 2021-04-02: 20 mL

## 2021-04-02 MED ORDER — FENTANYL CITRATE (PF) 100 MCG/2ML IJ SOLN
100.0000 ug | Freq: Once | INTRAMUSCULAR | Status: AC
  Administered 2021-04-02: 100 ug via INTRAVENOUS

## 2021-04-02 MED ORDER — SODIUM CHLORIDE (PF) 0.9 % IJ SOLN
INTRAMUSCULAR | Status: AC
  Filled 2021-04-02: qty 10

## 2021-04-02 MED ORDER — HYDROCODONE-ACETAMINOPHEN 5-325 MG PO TABS: 1.0000 | ORAL_TABLET | Freq: Four times a day (QID) | ORAL | 0 refills | Status: DC | PRN

## 2021-04-02 MED ORDER — FENTANYL CITRATE (PF) 100 MCG/2ML IJ SOLN
INTRAMUSCULAR | Status: AC
  Filled 2021-04-02: qty 2

## 2021-04-02 MED ORDER — BUPIVACAINE-EPINEPHRINE (PF) 0.25% -1:200000 IJ SOLN
INTRAMUSCULAR | Status: AC
  Filled 2021-04-02: qty 60

## 2021-04-02 MED ORDER — ACETAMINOPHEN 500 MG PO TABS
ORAL_TABLET | ORAL | Status: AC
  Filled 2021-04-02: qty 5

## 2021-04-02 MED ORDER — VANCOMYCIN HCL IN DEXTROSE 1-5 GM/200ML-% IV SOLN
INTRAVENOUS | Status: AC
  Filled 2021-04-02: qty 200

## 2021-04-02 MED ORDER — FENTANYL CITRATE (PF) 100 MCG/2ML IJ SOLN
INTRAMUSCULAR | Status: DC | PRN
  Administered 2021-04-02 (×2): 25 ug via INTRAVENOUS
  Administered 2021-04-02: 100 ug via INTRAVENOUS
  Administered 2021-04-02: 50 ug via INTRAVENOUS

## 2021-04-02 MED ORDER — LIDOCAINE HCL (PF) 2 % IJ SOLN
INTRAMUSCULAR | Status: AC
  Filled 2021-04-02: qty 5

## 2021-04-02 MED ORDER — MIDAZOLAM HCL 2 MG/2ML IJ SOLN
INTRAMUSCULAR | Status: AC
  Filled 2021-04-02: qty 2

## 2021-04-02 MED ORDER — VANCOMYCIN HCL IN DEXTROSE 1-5 GM/200ML-% IV SOLN
1000.0000 mg | INTRAVENOUS | Status: AC
  Administered 2021-04-02: 1000 mg via INTRAVENOUS

## 2021-04-02 MED ORDER — PROPOFOL 10 MG/ML IV BOLUS
INTRAVENOUS | Status: DC | PRN
  Administered 2021-04-02: 20 mg via INTRAVENOUS
  Administered 2021-04-02: 140 mg via INTRAVENOUS

## 2021-04-02 MED ORDER — MIDAZOLAM HCL 2 MG/2ML IJ SOLN
2.0000 mg | Freq: Once | INTRAMUSCULAR | Status: AC
  Administered 2021-04-02: 2 mg via INTRAVENOUS

## 2021-04-02 MED ORDER — CELECOXIB 200 MG PO CAPS
200.0000 mg | ORAL_CAPSULE | ORAL | Status: AC
  Administered 2021-04-02: 200 mg via ORAL

## 2021-04-02 MED ORDER — ACETAMINOPHEN 10 MG/ML IV SOLN: 1000.0000 mg | Freq: Once | INTRAVENOUS | Status: DC | PRN

## 2021-04-02 MED ORDER — CHLORHEXIDINE GLUCONATE CLOTH 2 % EX PADS: 6.0000 | MEDICATED_PAD | Freq: Once | CUTANEOUS | Status: DC

## 2021-04-02 MED ORDER — OXYCODONE HCL 5 MG PO TABS: 5.0000 mg | ORAL_TABLET | Freq: Once | ORAL | Status: DC | PRN

## 2021-04-02 MED ORDER — LIDOCAINE 2% (20 MG/ML) 5 ML SYRINGE
INTRAMUSCULAR | Status: DC | PRN
  Administered 2021-04-02: 80 mg via INTRAVENOUS

## 2021-04-02 MED ORDER — TECHNETIUM TC 99M TILMANOCEPT KIT
1.0000 | PACK | Freq: Once | INTRAVENOUS | Status: AC | PRN
  Administered 2021-04-02: 1 via INTRADERMAL

## 2021-04-02 MED ORDER — CLONIDINE HCL (ANALGESIA) 100 MCG/ML EP SOLN
EPIDURAL | Status: DC | PRN
  Administered 2021-04-02: 100 ug

## 2021-04-02 MED ORDER — PROPOFOL 10 MG/ML IV BOLUS
INTRAVENOUS | Status: AC
  Filled 2021-04-02: qty 20

## 2021-04-02 MED ORDER — DEXAMETHASONE SODIUM PHOSPHATE 10 MG/ML IJ SOLN
INTRAMUSCULAR | Status: DC | PRN
  Administered 2021-04-02: 10 mg
  Administered 2021-04-02: 5 mg

## 2021-04-02 MED ORDER — ONDANSETRON HCL 4 MG/2ML IJ SOLN: 4.0000 mg | Freq: Once | INTRAMUSCULAR | Status: DC | PRN

## 2021-04-02 MED ORDER — ACETAMINOPHEN 500 MG PO TABS
1000.0000 mg | ORAL_TABLET | ORAL | Status: AC
  Administered 2021-04-02: 1000 mg via ORAL

## 2021-04-02 MED ORDER — METHYLENE BLUE 0.5 % INJ SOLN
INTRAVENOUS | Status: AC
  Filled 2021-04-02: qty 10

## 2021-04-02 MED ORDER — BUPIVACAINE-EPINEPHRINE (PF) 0.5% -1:200000 IJ SOLN
INTRAMUSCULAR | Status: DC | PRN
  Administered 2021-04-02 (×6): 5 mL via PERINEURAL

## 2021-04-02 MED ORDER — LACTATED RINGERS IV SOLN: INTRAVENOUS | Status: DC

## 2021-04-02 MED ORDER — GABAPENTIN 300 MG PO CAPS
ORAL_CAPSULE | ORAL | Status: AC
  Filled 2021-04-02: qty 1

## 2021-04-02 MED ORDER — OXYCODONE HCL 5 MG/5ML PO SOLN: 5.0000 mg | Freq: Once | ORAL | Status: DC | PRN

## 2021-04-02 MED ORDER — ONDANSETRON HCL 4 MG/2ML IJ SOLN
INTRAMUSCULAR | Status: DC | PRN
  Administered 2021-04-02: 4 mg via INTRAVENOUS

## 2021-04-02 MED ORDER — ACETAMINOPHEN 325 MG PO TABS: 325.0000 mg | ORAL_TABLET | ORAL | Status: DC | PRN

## 2021-04-02 SURGICAL SUPPLY — 60 items
ADH SKN CLS APL DERMABOND .7 (GAUZE/BANDAGES/DRESSINGS) ×2
APL PRP STRL LF DISP 70% ISPRP (MISCELLANEOUS) ×2
APPLIER CLIP 11 MED OPEN (CLIP) ×3
APPLIER CLIP 9.375 MED OPEN (MISCELLANEOUS) ×3
APR CLP MED 11 20 MLT OPN (CLIP) ×2
APR CLP MED 9.3 20 MLT OPN (MISCELLANEOUS) ×2
BIOPATCH RED 1 DISK 7.0 (GAUZE/BANDAGES/DRESSINGS) IMPLANT
BLADE SURG 10 STRL SS (BLADE) IMPLANT
BLADE SURG 15 STRL LF DISP TIS (BLADE) ×2 IMPLANT
BLADE SURG 15 STRL SS (BLADE) ×3
CANISTER SUC SOCK COL 7IN (MISCELLANEOUS) IMPLANT
CANISTER SUCT 1200ML W/VALVE (MISCELLANEOUS) ×3 IMPLANT
CHLORAPREP W/TINT 26 (MISCELLANEOUS) ×3 IMPLANT
CLIP APPLIE 11 MED OPEN (CLIP) ×2 IMPLANT
CLIP APPLIE 9.375 MED OPEN (MISCELLANEOUS) ×2 IMPLANT
COVER BACK TABLE 60X90IN (DRAPES) ×3 IMPLANT
COVER MAYO STAND STRL (DRAPES) ×3 IMPLANT
COVER PROBE W GEL 5X96 (DRAPES) ×3 IMPLANT
DECANTER SPIKE VIAL GLASS SM (MISCELLANEOUS) IMPLANT
DERMABOND ADVANCED (GAUZE/BANDAGES/DRESSINGS) ×1
DERMABOND ADVANCED .7 DNX12 (GAUZE/BANDAGES/DRESSINGS) ×2 IMPLANT
DRAIN CHANNEL 19F RND (DRAIN) IMPLANT
DRAIN HEMOVAC 1/8 X 5 (WOUND CARE) IMPLANT
DRAPE LAPAROSCOPIC ABDOMINAL (DRAPES) ×3 IMPLANT
DRAPE UTILITY XL STRL (DRAPES) ×3 IMPLANT
DRSG TEGADERM 2-3/8X2-3/4 SM (GAUZE/BANDAGES/DRESSINGS) IMPLANT
ELECT COATED BLADE 2.86 ST (ELECTRODE) ×3 IMPLANT
ELECT REM PT RETURN 9FT ADLT (ELECTROSURGICAL) ×3
ELECTRODE REM PT RTRN 9FT ADLT (ELECTROSURGICAL) ×2 IMPLANT
EVACUATOR SILICONE 100CC (DRAIN) IMPLANT
GLOVE SURG ENC MOIS LTX SZ7.5 (GLOVE) ×3 IMPLANT
GLOVE SURG UNDER POLY LF SZ7 (GLOVE) ×9 IMPLANT
GOWN STRL REUS W/ TWL LRG LVL3 (GOWN DISPOSABLE) ×6 IMPLANT
GOWN STRL REUS W/TWL LRG LVL3 (GOWN DISPOSABLE) ×9
ILLUMINATOR WAVEGUIDE N/F (MISCELLANEOUS) IMPLANT
KIT MARKER MARGIN INK (KITS) ×3 IMPLANT
LIGHT WAVEGUIDE WIDE FLAT (MISCELLANEOUS) IMPLANT
NDL SAFETY ECLIPSE 18X1.5 (NEEDLE) IMPLANT
NEEDLE HYPO 18GX1.5 SHARP (NEEDLE)
NEEDLE HYPO 25X1 1.5 SAFETY (NEEDLE) ×3 IMPLANT
NS IRRIG 1000ML POUR BTL (IV SOLUTION) ×3 IMPLANT
PACK BASIN DAY SURGERY FS (CUSTOM PROCEDURE TRAY) ×3 IMPLANT
PENCIL SMOKE EVACUATOR (MISCELLANEOUS) ×3 IMPLANT
SLEEVE SCD COMPRESS KNEE MED (STOCKING) ×3 IMPLANT
SPONGE T-LAP 18X18 ~~LOC~~+RFID (SPONGE) ×3 IMPLANT
STAPLER VISISTAT 35W (STAPLE) IMPLANT
SUT ETHILON 3 0 PS 1 (SUTURE) IMPLANT
SUT MON AB 4-0 PC3 18 (SUTURE) ×6 IMPLANT
SUT SILK 2 0 SH (SUTURE) IMPLANT
SUT VIC AB 3-0 54X BRD REEL (SUTURE) IMPLANT
SUT VIC AB 3-0 BRD 54 (SUTURE)
SUT VIC AB 3-0 SH 27 (SUTURE)
SUT VIC AB 3-0 SH 27X BRD (SUTURE) IMPLANT
SUT VICRYL 3-0 CR8 SH (SUTURE) ×3 IMPLANT
SYR BULB EAR ULCER 3OZ GRN STR (SYRINGE) IMPLANT
SYR CONTROL 10ML LL (SYRINGE) ×3 IMPLANT
TOWEL GREEN STERILE FF (TOWEL DISPOSABLE) ×3 IMPLANT
TRAY FAXITRON CT DISP (TRAY / TRAY PROCEDURE) ×3 IMPLANT
TUBE CONNECTING 20X1/4 (TUBING) ×3 IMPLANT
YANKAUER SUCT BULB TIP NO VENT (SUCTIONS) ×3 IMPLANT

## 2021-04-02 NOTE — Progress Notes (Signed)
Assisted Dr. Hatchett with left, ultrasound guided, pectoralis block. Side rails up, monitors on throughout procedure. See vital signs in flow sheet. Tolerated Procedure well. 

## 2021-04-02 NOTE — Anesthesia Procedure Notes (Addendum)
Anesthesia Regional Block: Pectoralis block   Pre-Anesthetic Checklist: , timeout performed,  Correct Patient, Correct Site, Correct Laterality,  Correct Procedure, Correct Position, site marked,  Risks and benefits discussed,  Surgical consent,  Pre-op evaluation,  At surgeon's request and post-op pain management  Laterality: Left and N/A  Prep: chloraprep       Needles:  Injection technique: Single-shot  Needle Type: Echogenic Stimulator Needle     Needle Length: 9cm  Needle Gauge: 20   Needle insertion depth: 3 cm   Additional Needles:   Procedures:,,,, ultrasound used (permanent image in chart),,    Narrative:  Start time: 04/02/2021 12:40 PM End time: 04/02/2021 12:48 PM Injection made incrementally with aspirations every 5 mL.  Performed by: Personally  Anesthesiologist: Lyn Hollingshead, MD

## 2021-04-02 NOTE — Anesthesia Preprocedure Evaluation (Signed)
Anesthesia Evaluation  Patient identified by MRN, date of birth, ID band Patient awake    Reviewed: Allergy & Precautions, H&P , NPO status , Patient's Chart, lab work & pertinent test results  Airway Mallampati: II  TM Distance: >3 FB Neck ROM: full    Dental no notable dental hx.    Pulmonary sleep apnea and Continuous Positive Airway Pressure Ventilation ,    Pulmonary exam normal breath sounds clear to auscultation       Cardiovascular Exercise Tolerance: Good hypertension, Pt. on medications Normal cardiovascular exam     Neuro/Psych negative psych ROS   GI/Hepatic negative GI ROS, Neg liver ROS,   Endo/Other  Morbid obesity  Renal/GU negative Renal ROS  negative genitourinary   Musculoskeletal   Abdominal (+) + obese,   Peds  Hematology negative hematology ROS (+)   Anesthesia Other Findings   Reproductive/Obstetrics negative OB ROS                             Anesthesia Physical  Anesthesia Plan  ASA: 3  Anesthesia Plan: General   Post-op Pain Management:  Regional for Post-op pain   Induction: Intravenous  PONV Risk Score and Plan: 3 and Ondansetron, Propofol infusion, Dexamethasone and Midazolam  Airway Management Planned: LMA  Additional Equipment: None  Intra-op Plan:   Post-operative Plan: Extubation in OR  Informed Consent: I have reviewed the patients History and Physical, chart, labs and discussed the procedure including the risks, benefits and alternatives for the proposed anesthesia with the patient or authorized representative who has indicated his/her understanding and acceptance.     Dental Advisory Given and Dental advisory given  Plan Discussed with: CRNA  Anesthesia Plan Comments: ( )        Anesthesia Quick Evaluation

## 2021-04-02 NOTE — Anesthesia Postprocedure Evaluation (Signed)
Anesthesia Post Note  Patient: Christy Strickland  Procedure(s) Performed: LEFT BREAST LUMPECTOMY WITH RADIOACTIVE SEED AND SENTINEL LYMPH NODE BIOPSY (Left: Breast) RADIOACTIVE SEED GUIDED AXILLARY SENTINEL LYMPH NODE DISSECTION (Left: Axilla)     Patient location during evaluation: PACU Anesthesia Type: General Level of consciousness: sedated Pain management: pain level controlled Vital Signs Assessment: post-procedure vital signs reviewed and stable Respiratory status: spontaneous breathing Cardiovascular status: stable Postop Assessment: no apparent nausea or vomiting Anesthetic complications: no   No notable events documented.  Last Vitals:  Vitals:   04/02/21 1605 04/02/21 1615  BP:  136/70  Pulse: (!) 54 66  Resp: (!) 26 (!) 21  Temp:    SpO2: 93% 91%    Last Pain:  Vitals:   04/02/21 1546  TempSrc:   PainSc: 0-No pain                 Huston Foley

## 2021-04-02 NOTE — Anesthesia Procedure Notes (Signed)
Procedure Name: LMA Insertion Date/Time: 04/02/2021 2:24 PM Performed by: Maryella Shivers, CRNA Pre-anesthesia Checklist: Patient identified, Emergency Drugs available, Suction available and Patient being monitored Patient Re-evaluated:Patient Re-evaluated prior to induction Oxygen Delivery Method: Circle system utilized Preoxygenation: Pre-oxygenation with 100% oxygen Induction Type: IV induction Ventilation: Mask ventilation without difficulty LMA: LMA inserted LMA Size: 4.0 Number of attempts: 1 Airway Equipment and Method: Bite block Placement Confirmation: positive ETCO2 Tube secured with: Tape Dental Injury: Teeth and Oropharynx as per pre-operative assessment

## 2021-04-02 NOTE — Op Note (Signed)
04/02/2021  3:39 PM  PATIENT:  Christy Strickland  70 y.o. female  PRE-OPERATIVE DIAGNOSIS:  LEFT BREAST CANCER  POST-OPERATIVE DIAGNOSIS:  LEFT BREAST CANCER  PROCEDURE:  Procedure(s) with comments: LEFT BREAST LUMPECTOMY WITH RADIOACTIVE SEED LOCALIZATION AND DEEP LEFT AXILLARY SENTINEL LYMPH NODE BIOPSY WITH RADIOACTIVE SEED GUIDED LEFT AXILLARY TARGETED LYMPH NODE DISSECTION (Left)  SURGEON:  Surgeon(s) and Role:    * Jovita Kussmaul, MD - Primary  PHYSICIAN ASSISTANT:   ASSISTANTS: none   ANESTHESIA:   local and general  EBL:  20 mL   BLOOD ADMINISTERED:none  DRAINS: none   LOCAL MEDICATIONS USED:  MARCAINE     SPECIMEN:  Source of Specimen:  left breast tissue with additional superiorlateral margin  and sentinel node biopsy x 2 with targeted node  DISPOSITION OF SPECIMEN:  PATHOLOGY  COUNTS:  YES  TOURNIQUET:  * No tourniquets in log *  DICTATION: .Dragon Dictation  After informed consent was obtained the patient was brought to the operating room and placed in the supine position on the operating table.  After adequate induction of general anesthesia the patient's left chest, breast, and axillary area were prepped with ChloraPrep, allowed to dry, and draped in usual sterile manner.  An appropriate timeout was performed.  Previously an I-125 seed was placed in the upper outer quadrant of the left breast to mark an area of invasive breast cancer and a second I-125 seed was placed in the left axilla to mark a single positive lymph node.  Also earlier in the day the patient underwent injection 1 mCi of technetium sulfur colloid in the subareolar position on the left.  The neoprobe was initially set to technetium and there seem to be a signal in the left axilla.  The neoprobe was switched to I-125 and there was also a good seed signal in the left axilla.  The area overlying this was infiltrated with quarter percent Marcaine.  A small transversely oriented incision was made  with a 15 blade knife.  The incision was carried through the skin and subcutaneous tissue sharply with the electrocautery until the deep left axillary space was entered.  I was able to identify the lymph node with the seed.  This was excised sharply with the electrocautery and the surrounding small vessels and lymphatics were controlled with clips.  Specimen radiograph confirmed the presence of the seed and node.  This was sent to pathology as the targeted node.  There was some low-level signal in the left axilla and as the axilla was explored bluntly I was able to identify a couple of palpable lymph nodes.  These were also excised sharply with the electrocautery and the surrounding small vessels and lymphatics were controlled with clips.  These nodes had small signals in the.  No other hot or palpable nodes were identified in the left axilla.  These nodes were sent as sentinel nodes numbers 1 and 2.  The deep layer of the axilla was then closed with interrupted 3-0 Vicryl stitches.  The skin was closed with a running 4-0 Monocryl subcuticular stitch.  Attention was then turned to the left breast.  The neoprobe was set to I-125 again.  The area of radioactivity was readily identified.  The area around this was infiltrated with quarter percent Marcaine.  A curvilinear incision was made along the upper outer edge of the areola of the left breast with a 15 blade knife.  The incision was carried through the skin and subcutaneous tissue sharply  with the electrocautery.  The dissection was then carried towards the radioactive seed under the direction of the neoprobe.  Once I more closely approached the radioactive seed I then removed a circular portion of breast tissue sharply with the electrocautery around the radioactive seed while checking the area of radioactivity frequently.  Once the specimen was removed it was oriented with the appropriate paint colors.  A specimen radiograph was obtained that showed the clip and  seed to be near the center of the specimen.  I did elect to take an additional superior lateral margin because it felt close to the tumor.  This was marked appropriately and also sent to pathology for further evaluation.  Hemostasis was achieved using the Bovie electrocautery.  The wound was irrigated with saline and infiltrated with more quarter percent Marcaine.  The deep layer of the wound was then closed with layers of interrupted 3-0 Vicryl stitches.  The skin was closed with interrupted 4-0 Monocryl subcuticular stitches.  Dermabond dressings were applied.  The patient tolerated the procedure well.  At the end of the case all needle sponge and instrument counts were correct.  The patient was then awakened and taken recovery in stable condition.  PLAN OF CARE: Discharge to home after PACU  PATIENT DISPOSITION:  PACU - hemodynamically stable.   Delay start of Pharmacological VTE agent (>24hrs) due to surgical blood loss or risk of bleeding: not applicable

## 2021-04-02 NOTE — H&P (Signed)
MRN: [id number] DOB: [birth year]   Subjective    Chief Complaint: No chief complaint on file.       History of Present Illness: Christy Strickland is a 70 y.o. female who is seen today as an office consultation at the request of Dr. Pasty Arch for evaluation of No chief complaint on file. .     We are asked to see the patient in consultation by Dr. Lindi Adie to evaluate her for a new left breast cancer.  The patient is a 70 year old white female who recently went for a routine screening mammogram.  At that time she was found to have a 1.1 cm mass in the upper outer quadrant of the left breast with 1 abnormal looking lymph node.  Both were biopsied and came back as a grade 2 invasive ductal cancer that was ER and PR positive and HER2 negative with a Ki-67 of 15%.  She does not smoke.  She has no family history of breast cancer.     Review of Systems: A complete review of systems was obtained from the patient.  I have reviewed this information and discussed as appropriate with the patient.  See HPI as well for other ROS.   ROS      Medical History: Past Medical History      Past Medical History:  Diagnosis Date   Anxiety     Arthritis     History of cancer     Hyperlipidemia     Hypertension     Sleep apnea             Patient Active Problem List  Diagnosis   Malignant neoplasm of upper-outer quadrant of left breast in female, estrogen receptor positive (CMS-HCC)   Cervical radiculopathy      Past Surgical History       Past Surgical History:  Procedure Laterality Date   Back Surgery        Date Unknown   Bilateral Knee Replacements        Date Unknown   COLON SURGERY       Removal of Pituitary gland        Date Unknown        Allergies       Allergies  Allergen Reactions   Mushroom Abdominal Pain and Other (See Comments)      Abdominal pain.     Amoxicillin-Pot Clavulanate Abdominal Pain, Itching and Other (See Comments)      Has patient had a PCN reaction causing  immediate rash, facial/tongue/throat swelling, SOB or lightheadedness with hypotension: No Has patient had a PCN reaction causing severe rash involving mucus membranes or skin necrosis: No Has patient had a PCN reaction that required hospitalization: No Has patient had a PCN reaction occurring within the last 10 years: No If all of the above answers are "NO", then may proceed with Cephalosporin use.     Meperidine Anxiety              Current Outpatient Medications on File Prior to Visit  Medication Sig Dispense Refill   ascorbic acid, vitamin C, (VITAMIN C) 250 MG tablet Take by mouth       hydroCHLOROthiazide (HYDRODIURIL) 25 MG tablet TAKE 1 TABLET BY MOUTH EVERY DAY IN THE MORNING 90 DAYS       multivitamin with minerals tablet Take 1 tablet by mouth once daily        No current facility-administered medications on file prior to visit.  Family History       Family History  Problem Relation Age of Onset   Obesity Mother     Obesity Father     Coronary Artery Disease (Blocked arteries around heart) Father     Obesity Sister     High blood pressure (Hypertension) Sister     Stroke Brother     Obesity Brother     Diabetes Brother     Coronary Artery Disease (Blocked arteries around heart) Brother          Social History       Tobacco Use  Smoking Status Never Smoker  Smokeless Tobacco Never Used      Social History  Social History        Socioeconomic History   Marital status: Unknown  Tobacco Use   Smoking status: Never Smoker   Smokeless tobacco: Never Used  Scientific laboratory technician Use: Never used  Substance and Sexual Activity   Alcohol use: Never        Objective:      There were no vitals filed for this visit.  There is no height or weight on file to calculate BMI.   Physical Exam Vitals reviewed.  Constitutional:      General: She is not in acute distress.    Appearance: Normal appearance.  HENT:     Head: Normocephalic and atraumatic.      Right Ear: External ear normal.     Left Ear: External ear normal.     Nose: Nose normal.     Mouth/Throat:     Mouth: Mucous membranes are moist.     Pharynx: Oropharynx is clear.  Eyes:     General: No scleral icterus.    Extraocular Movements: Extraocular movements intact.     Conjunctiva/sclera: Conjunctivae normal.     Pupils: Pupils are equal, round, and reactive to light.  Cardiovascular:     Rate and Rhythm: Normal rate and regular rhythm.     Pulses: Normal pulses.     Heart sounds: Normal heart sounds.  Pulmonary:     Effort: Pulmonary effort is normal. No respiratory distress.     Breath sounds: Normal breath sounds.  Abdominal:     General: Bowel sounds are normal.     Palpations: Abdomen is soft.     Tenderness: There is no abdominal tenderness.  Musculoskeletal:        General: No swelling, tenderness or deformity. Normal range of motion.     Cervical back: Normal range of motion and neck supple.  Skin:    General: Skin is warm and dry.     Coloration: Skin is not jaundiced.  Neurological:     General: No focal deficit present.     Mental Status: She is alert and oriented to person, place, and time.  Psychiatric:        Mood and Affect: Mood normal.        Behavior: Behavior normal.     Breast: There is no palpable mass in either breast.  There is no palpable axillary, supraclavicular, or cervical lymphadenopathy.         Labs, Imaging and Diagnostic Testing:     Assessment and Plan:  Diagnoses and all orders for this visit:   Malignant neoplasm of upper-outer quadrant of left breast in female, estrogen receptor positive (CMS-HCC)       The patient appears to have a small cancer in the upper outer quadrant of the left breast  with 1 positive lymph node and all favorable markers.  I have discussed with her at this point the different options for treatment and at this point I think it would be reasonable to perform a targeted node dissection in addition  into a sentinel node biopsy and if that is the only positive node then she may not need further lymph node evaluation.  She is a good candidate for breast conservation and she is very interested in this as well.  I have discussed with her in detail the risks and benefits of the operation as well as some of the technical aspects including the use of a radioactive seed for localization and she understands and wishes to proceed.  She will meet with medical and radiation oncology to discuss adjuvant therapy.  She will also meet with physical therapy for preoperative lymphedema testing   No follow-ups on file.   Landry Corporal, MD    I spent a total of 60 minutes in both face-to-face and non-face-to-face activities for this visit on the date of this encounter.

## 2021-04-02 NOTE — Transfer of Care (Signed)
Immediate Anesthesia Transfer of Care Note  Patient: Elverda Pry  Procedure(s) Performed: LEFT BREAST LUMPECTOMY WITH RADIOACTIVE SEED AND SENTINEL LYMPH NODE BIOPSY (Left: Breast) RADIOACTIVE SEED GUIDED AXILLARY SENTINEL LYMPH NODE DISSECTION (Left: Axilla)  Patient Location: PACU  Anesthesia Type:GA combined with regional for post-op pain  Level of Consciousness: sedated  Airway & Oxygen Therapy: Patient Spontanous Breathing and Patient connected to face mask oxygen  Post-op Assessment: Report given to RN and Post -op Vital signs reviewed and stable  Post vital signs: Reviewed and stable  Last Vitals:  Vitals Value Taken Time  BP 90/61 04/02/21 1546  Temp    Pulse 54 04/02/21 1548  Resp 14 04/02/21 1548  SpO2 94 % 04/02/21 1548  Vitals shown include unvalidated device data.  Last Pain:  Vitals:   04/02/21 1201  TempSrc: Oral  PainSc: 0-No pain         Complications: No notable events documented.

## 2021-04-02 NOTE — Interval H&P Note (Signed)
History and Physical Interval Note:  04/02/2021 11:34 AM  Christy Strickland  has presented today for surgery, with the diagnosis of LEFT BREAST CANCER.  The various methods of treatment have been discussed with the patient and family. After consideration of risks, benefits and other options for treatment, the patient has consented to  Procedure(s) with comments: LEFT BREAST LUMPECTOMY WITH RADIOACTIVE SEED AND SENTINEL LYMPH NODE BIOPSY (Left) - 120 MINUTES ROOM 2 IQ RADIOACTIVE SEED GUIDED AXILLARY SENTINEL LYMPH NODE DISSECTION (Left) as a surgical intervention.  The patient's history has been reviewed, patient examined, no change in status, stable for surgery.  I have reviewed the patient's chart and labs.  Questions were answered to the patient's satisfaction.     Christy Strickland

## 2021-04-02 NOTE — Discharge Instructions (Addendum)
May take Tylenol after 6:30 pm if needed.  May take NSAIDS after 6:30 pm if needed.   Post Anesthesia Home Care Instructions  Activity: Get plenty of rest for the remainder of the day. A responsible individual must stay with you for 24 hours following the procedure.  For the next 24 hours, DO NOT: -Drive a car -Paediatric nurse -Drink alcoholic beverages -Take any medication unless instructed by your physician -Make any legal decisions or sign important papers.  Meals: Start with liquid foods such as gelatin or soup. Progress to regular foods as tolerated. Avoid greasy, spicy, heavy foods. If nausea and/or vomiting occur, drink only clear liquids until the nausea and/or vomiting subsides. Call your physician if vomiting continues.  Special Instructions/Symptoms: Your throat may feel dry or sore from the anesthesia or the breathing tube placed in your throat during surgery. If this causes discomfort, gargle with warm salt water. The discomfort should disappear within 24 hours.  If you had a scopolamine patch placed behind your ear for the management of post- operative nausea and/or vomiting:  1. The medication in the patch is effective for 72 hours, after which it should be removed.  Wrap patch in a tissue and discard in the trash. Wash hands thoroughly with soap and water. 2. You may remove the patch earlier than 72 hours if you experience unpleasant side effects which may include dry mouth, dizziness or visual disturbances. 3. Avoid touching the patch. Wash your hands with soap and water after contact with the patch.     Post Anesthesia Home Care Instructions  Activity: Get plenty of rest for the remainder of the day. A responsible individual must stay with you for 24 hours following the procedure.  For the next 24 hours, DO NOT: -Drive a car -Paediatric nurse -Drink alcoholic beverages -Take any medication unless instructed by your physician -Make any legal decisions or sign  important papers.  Meals: Start with liquid foods such as gelatin or soup. Progress to regular foods as tolerated. Avoid greasy, spicy, heavy foods. If nausea and/or vomiting occur, drink only clear liquids until the nausea and/or vomiting subsides. Call your physician if vomiting continues.  Special Instructions/Symptoms: Your throat may feel dry or sore from the anesthesia or the breathing tube placed in your throat during surgery. If this causes discomfort, gargle with warm salt water. The discomfort should disappear within 24 hours.  If you had a scopolamine patch placed behind your ear for the management of post- operative nausea and/or vomiting:  1. The medication in the patch is effective for 72 hours, after which it should be removed.  Wrap patch in a tissue and discard in the trash. Wash hands thoroughly with soap and water. 2. You may remove the patch earlier than 72 hours if you experience unpleasant side effects which may include dry mouth, dizziness or visual disturbances. 3. Avoid touching the patch. Wash your hands with soap and water after contact with the patch.

## 2021-04-03 ENCOUNTER — Encounter (HOSPITAL_BASED_OUTPATIENT_CLINIC_OR_DEPARTMENT_OTHER): Payer: Self-pay | Admitting: General Surgery

## 2021-04-07 LAB — SURGICAL PATHOLOGY

## 2021-04-08 ENCOUNTER — Encounter: Payer: Self-pay | Admitting: *Deleted

## 2021-04-08 ENCOUNTER — Telehealth: Payer: Self-pay | Admitting: *Deleted

## 2021-04-08 NOTE — Telephone Encounter (Signed)
Ordered mammaprint per Dr. Lindi Adie. Faxed requisition to pathology and [facility]

## 2021-04-09 DIAGNOSIS — C50412 Malignant neoplasm of upper-outer quadrant of left female breast: Secondary | ICD-10-CM | POA: Diagnosis not present

## 2021-04-16 ENCOUNTER — Encounter: Payer: Self-pay | Admitting: Hematology and Oncology

## 2021-04-16 NOTE — Progress Notes (Signed)
Patient Care Team: Orpah Melter, MD as PCP - General (Family Medicine) Mauro Kaufmann, RN as Oncology Nurse Navigator Rockwell Germany, RN as Oncology Nurse Navigator Jovita Kussmaul, MD as Consulting Physician (General Surgery) Nicholas Lose, MD as Consulting Physician (Hematology and Oncology) Eppie Gibson, MD as Attending Physician (Radiation Oncology)  DIAGNOSIS:    ICD-10-CM   1. Malignant neoplasm of upper-outer quadrant of left breast in female, estrogen receptor positive ([place])  C50.412    Z17.0       SUMMARY OF ONCOLOGIC HISTORY: Oncology History  Malignant neoplasm of upper-outer quadrant of left breast in female, estrogen receptor positive ([place])  03/23/2021 Initial Diagnosis   Screening mammogram: a possible mass and abnormal lymph node in the left breast. Diagnostic mammogram and [place]: suspicious 1.1 cm mass at the left breast 2 o'clock position and enlarged left axillary lymph node. 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  ER 70%, PR 40%, Ki-67 15%, HER2 negative by [facility]   03/25/2021 Cancer Staging   Staging form: Breast, AJCC 8th Edition - Clinical stage from 03/25/2021: Stage IB (cT1c, cN1, cM0, G2, ER+, PR+, HER2-) - Signed by Nicholas Lose, MD on 03/25/2021 Stage prefix: Initial diagnosis Histologic grading system: 3 grade system   04/02/2021 Surgery   Left lumpectomy: Grade 2 IDC, 1.1 cm, with intermediate grade DCIS, margins negative, 1/4 lymph nodes positive with extracapsular extension, ER 70%, PR 40%, HER2 equal vocal negative FISH, Ki-67 15%     CHIEF COMPLIANT: Follow-up of left breast cancer  INTERVAL HISTORY: Christy Strickland is a 70 y.o. with above-mentioned history of left breast cancer. Left breast lumpectomy on 04/02/2021 with Dr. Jovita Kussmaul showed grade 2 invasive ductal carcinoma, intermediate grade DCIS, margins negative for carcinoma, and one left axillary lymph node positive for  metastatic carcinoma. She presents to the clinic today for follow-up.   ALLERGIES:  is allergic to mushroom extract complex, amoxicillin-pot clavulanate, and meperidine hcl.  MEDICATIONS:  Current Outpatient Medications  Medication Sig Dispense Refill   cetirizine (ZYRTEC) 10 MG tablet Take 10 mg by mouth daily.     hydrochlorothiazide (HYDRODIURIL) 25 MG tablet Take 25 mg by mouth daily.     HYDROcodone-acetaminophen (NORCO/VICODIN) 5-325 MG tablet Take 1 tablet by mouth every 6 (six) hours as needed for moderate pain or severe pain. 10 tablet 0   Multiple Vitamin (MULTIVITAMIN WITH MINERALS) TABS tablet Take 1 tablet by mouth daily.     naproxen (NAPROSYN) 500 MG tablet Take 500 mg by mouth as needed.     vitamin C (ASCORBIC ACID) 250 MG tablet Take 250 mg by mouth daily.     No current facility-administered medications for this visit.    PHYSICAL EXAMINATION: ECOG PERFORMANCE STATUS: 1 - Symptomatic but completely ambulatory  Vitals:   04/17/21 0941  BP: (!) 190/61  Pulse: 61  Resp: 19  Temp: 97.7 F (36.5 C)  SpO2: 100%   Filed Weights   04/17/21 0941  Weight: 265 lb 4.8 oz (120.3 kg)    BREAST: No palpable masses or nodules in either right or left breasts. No palpable axillary supraclavicular or infraclavicular adenopathy no breast tenderness or nipple discharge. (exam performed in the presence of a chaperone)  LABORATORY DATA:  I have reviewed the data as listed CMP Latest Ref Rng & Units 03/30/2021 03/25/2021 04/24/2018  Glucose 70 - 99 mg/dL 88 91 87  BUN 8 - 23 mg/dL 12 14 16  Creatinine 0.44 - 1.00 mg/dL 1.04(H) 1.08(H) 1.02(H)  Sodium 135 - 145 mmol/L 140 140 137  Potassium 3.5 - 5.1 mmol/L 3.7 3.3(L) 3.5  Chloride 98 - 111 mmol/L 103 102 99  CO2 22 - 32 mmol/L 27 27 28   Calcium 8.9 - 10.3 mg/dL 10.2 10.2 10.0  Total Protein 6.5 - 8.1 g/dL - 7.8 8.4(H)  Total Bilirubin 0.3 - 1.2 mg/dL - 0.6 0.6  Alkaline Phos 38 - 126 U/L - 61 59  AST 15 - 41 U/L - 31 19   ALT 0 - 44 U/L - 25 19    Lab Results  Component Value Date   WBC 7.1 03/25/2021   HGB 14.6 03/25/2021   HCT 42.0 03/25/2021   MCV 88.6 03/25/2021   PLT 341 03/25/2021   NEUTROABS 4.5 03/25/2021    ASSESSMENT & PLAN:  Malignant neoplasm of upper-outer quadrant of left breast in female, estrogen receptor positive (HCC) 04/02/2021:Left lumpectomy: Grade 2 IDC, 1.1 cm, with intermediate grade DCIS, margins negative, 1/4 lymph nodes positive with extracapsular extension, ER 70%, PR 40%, HER2 equal vocal negative FISH, Ki-67 15%  Pathology counseling: I discussed the final pathology report of the patient provided  a copy of this report. I discussed the margins as well as lymph node surgeries. We also discussed the final staging along with previously performed ER/PR and HER-2/neu testing.  Treatment plan: 1. MammaPrint testing to determine if chemotherapy would be of any benefit followed by 2. Adjuvant radiation therapy followed by 3. Adjuvant antiestrogen therapy  Return to clinic based upon MammaPrint test result    No orders of the defined types were placed in this encounter.  The patient has a good understanding of the overall plan. she agrees with it. she will call with any problems that may develop before the next visit here.  Total time spent: 20 mins including face to face time and time spent for planning, charting and coordination of care  Rulon Eisenmenger, MD, MPH 04/17/2021  I, Thana Ates, am acting as scribe for Dr. Nicholas Lose.  I have reviewed the above documentation for accuracy and completeness, and I agree with the above.

## 2021-04-17 ENCOUNTER — Encounter: Payer: Self-pay | Admitting: *Deleted

## 2021-04-17 ENCOUNTER — Inpatient Hospital Stay: Payer: Medicare Other | Attending: Hematology and Oncology | Admitting: Hematology and Oncology

## 2021-04-17 ENCOUNTER — Other Ambulatory Visit: Payer: Self-pay

## 2021-04-17 ENCOUNTER — Telehealth: Payer: Self-pay | Admitting: *Deleted

## 2021-04-17 DIAGNOSIS — Z880 Allergy status to penicillin: Secondary | ICD-10-CM | POA: Insufficient documentation

## 2021-04-17 DIAGNOSIS — Z170 Estrogen receptor positive status [ER+]: Secondary | ICD-10-CM | POA: Insufficient documentation

## 2021-04-17 DIAGNOSIS — C50412 Malignant neoplasm of upper-outer quadrant of left female breast: Secondary | ICD-10-CM | POA: Insufficient documentation

## 2021-04-17 DIAGNOSIS — Z79899 Other long term (current) drug therapy: Secondary | ICD-10-CM | POA: Diagnosis not present

## 2021-04-17 DIAGNOSIS — C773 Secondary and unspecified malignant neoplasm of axilla and upper limb lymph nodes: Secondary | ICD-10-CM | POA: Diagnosis not present

## 2021-04-17 NOTE — Assessment & Plan Note (Signed)
04/02/2021:Left lumpectomy: Grade 2 IDC, 1.1 cm, with intermediate grade DCIS, margins negative, 1/4 lymph nodes positive with extracapsular extension, ER 70%, PR 40%, HER2 equal vocal negative FISH, Ki-67 15%  Pathology counseling: I discussed the final pathology report of the patient provided  a copy of this report. I discussed the margins as well as lymph node surgeries. We also discussed the final staging along with previously performed ER/PR and HER-2/neu testing.  Treatment plan: 1. MammaPrint testing to determine if chemotherapy would be of any benefit followed by 2. Adjuvant radiation therapy followed by 3. Adjuvant antiestrogen therapy  Return to clinic based upon MammaPrint test result

## 2021-04-17 NOTE — Telephone Encounter (Signed)
Received mammaprint results of low risk. Patient is aware. Team notified. Referral placed for Dr. Isidore Moos

## 2021-04-20 ENCOUNTER — Encounter: Payer: Self-pay | Admitting: *Deleted

## 2021-04-21 ENCOUNTER — Encounter (HOSPITAL_COMMUNITY): Payer: Self-pay

## 2021-04-27 NOTE — Progress Notes (Signed)
Location of Breast Cancer:  Malignant neoplasm of upper-outer quadrant of LEFT breast, estrogen receptor positive  Histology per Pathology Report:  04/02/2021 FINAL MICROSCOPIC DIAGNOSIS:  A. LYMPH NODE, LEFT AXILLARY TARGETED, EXCISION:  - Metastatic carcinoma in one of one lymph nodes (1/1).  - Extracapsular extension present.  B. LYMPH NODE, LEFT AXILLARY #1, SENTINEL, EXCISION:  - One of one lymph nodes negative for carcinoma (0/1).  C. LYMPH NODE, LEFT AXILLARY #2, SENTINEL, EXCISION:  - One of one lymph nodes negative for carcinoma (0/1).  D. LYMPH NODE, LEFT AXILLARY, SENTINEL, EXCISION:  - One of one lymph nodes negative for carcinoma (0/1).  E. BREAST, LEFT, LUMPECTOMY:  - Invasive ductal carcinoma, grade 2, spanning 1.1 cm.  - Intermediate grade ductal carcinoma in situ.  - Margins are negative for carcinoma.  - Biopsy site.  - See oncology table.  F. BREAST, LEFT SUPERIOR/LATERAL MARGIN, EXCISION:  - Benign breast tissue.   Receptor Status: ER(70%), PR (40%), Her2-neu (Negative via FISH), Ki-67(15%)  Did patient present with symptoms (if so, please note symptoms) or was this found on screening mammography?: Screening mammogram on 03/23/21: a possible mass and abnormal lymph node in the left breast. Diagnostic mammogram and [place]: suspicious 1.1 cm mass at the left breast 2 o'clock position and enlarged left axillary lymph node.  Past/Anticipated interventions by surgeon, if any: 04/02/2021 Dr. Autumn Messing LEFT BREAST LUMPECTOMY WITH RADIOACTIVE SEED LOCALIZATION DEEP LEFT AXILLARY SENTINEL LYMPH NODE BIOPSY WITH RADIOACTIVE SEED GUIDED LEFT AXILLARY TARGETED LYMPH NODE DISSECTION  Past/Anticipated interventions by medical oncology, if any:  Under care of Dr. Nicholas Lose 04/17/2021 --Treatment plan: MammaPrint testing to determine if chemotherapy would be of any benefit followed by Adjuvant radiation therapy followed by Adjuvant antiestrogen therapy --Return to clinic  based upon MammaPrint test result 04/17/21 Varney Biles Martini-Navigator: "Received mammaprint results of low risk. Patient is aware. Team notified"  Lymphedema issues, if any:  Patient denies. Does the exercises provided from PT during breast clinic every morning    Pain issues, if any:  Reports occasional soreness/tenderness to left breast, but denies pain  SAFETY ISSUES: Prior radiation? No Pacemaker/ICD? No Possible current pregnancy? No--postmenopausal Is the patient on methotrexate? No  Current Complaints / other details:  Nothing else of note

## 2021-04-28 NOTE — Progress Notes (Signed)
Radiation Oncology         [phone] ________________________________  Name: Christy Strickland MRN: [id number]  Date: 04/29/2021  DOB: 1951/04/27  Follow-Up Visit Note  Outpatient  CC: Orpah Melter, MD  Nicholas Lose, MD  Diagnosis:      ICD-10-CM   1. Malignant neoplasm of upper-outer quadrant of left breast in female, estrogen receptor positive ([place])  C50.412    Z17.0        Stage IB (cT1c, cN1, cM0) Left Breast UOQ, Invasive Ductal Carcinoma with intermediate grade DCIS, ER+ / PR+ / Her2-, Grade 2  pT1c, pN1a   CHIEF COMPLAINT: Here to discuss management of left breast cancer  Narrative:  The patient returns today for follow-up.     Since breast clinic consultation date on 03/25/21, the patient opted to proceed with left breast lumpectomy on the date of 04/02/21 under the care of Dr. Marlou Starks. Pathology from the procedure revealed:  tumor size of 1.1 cm; histology of invasive ductal carcinoma with intermediate grade ductal carcinoma in-situ; margin status to invasive disease negative; margin status to in situ disease negative; nodal status of metastatic carcinoma in 1/1 left axillary targeted lymph nodes + ECE, 0/3 left axillary sentinel nodes;  ER status: 70%, positive with moderate staining intensity; PR status 40%, positive with strong staining intensity, Her2 status negative; Ki67 15%; Grade 2.  MammaPrint testing collected from surgical sample on 04/02/21 revealed the patient as low risk luminal-type A.   The patient has not received any additional imaging since her initial consultation date on 03/25/21.   Symptomatically, the patient reports: Lymphedema issues, if any:  Patient denies. Does the exercises provided from PT during breast clinic every morning    Pain issues, if any:  Reports occasional soreness/tenderness to left breast, but denies pain  SAFETY ISSUES: Prior radiation? No Pacemaker/ICD? No Possible current pregnancy? No--postmenopausal Is the  patient on methotrexate? No  Current Complaints / other details:  Nothing else of note        ALLERGIES:  is allergic to mushroom extract complex, amoxicillin-pot clavulanate, and meperidine hcl.  Meds: Current Outpatient Medications  Medication Sig Dispense Refill   cetirizine (ZYRTEC) 10 MG tablet Take 10 mg by mouth daily.     hydrochlorothiazide (HYDRODIURIL) 25 MG tablet Take 25 mg by mouth daily.     HYDROcodone-acetaminophen (NORCO/VICODIN) 5-325 MG tablet Take 1 tablet by mouth every 6 (six) hours as needed for moderate pain or severe pain. 10 tablet 0   Multiple Vitamin (MULTIVITAMIN WITH MINERALS) TABS tablet Take 1 tablet by mouth daily.     naproxen (NAPROSYN) 500 MG tablet Take 500 mg by mouth as needed.     potassium chloride (KLOR-CON) 10 MEQ tablet Take 10 mEq by mouth 2 (two) times daily.     vitamin C (ASCORBIC ACID) 250 MG tablet Take 250 mg by mouth daily.     No current facility-administered medications for this encounter.    Physical Findings:  height is 5' 7"  (1.702 m) and weight is 263 lb 8 oz (119.5 kg). Her temporal temperature is 97.2 F (36.2 C) (abnormal). Her blood pressure is 192/172 (abnormal) and her pulse is 66. Her respiration is 20 and oxygen saturation is 95%. .     General: Alert and oriented, in no acute distress   Musculoskeletal: Satisfactory range of motion in left shoulder  Neurologic: No obvious focalities. Speech is fluent.   Psychiatric: Judgment and insight are intact. Affect is appropriate.  Breast exam reveals  satisfactory healing of left breast surgical scars  Lab Findings: Lab Results  Component Value Date   WBC 7.1 03/25/2021   HGB 14.6 03/25/2021   HCT 42.0 03/25/2021   MCV 88.6 03/25/2021   PLT 341 03/25/2021    @LASTCHEMISTRY @  Radiographic Findings: NM Sentinel Node Inj-No Rpt (Breast)  Result Date: 04/02/2021 Sulfur Colloid was injected by the Nuclear Medicine Technologist for sentinel lymph node localization.    MM Breast Surgical Specimen  Result Date: 04/02/2021 CLINICAL DATA:  Patient status post left lumpectomy. EXAM: SPECIMEN RADIOGRAPH OF THE LEFT BREAST COMPARISON:  Previous exam(s). FINDINGS: Status post excision of the left breast. The radioactive seed and biopsy marker clip are present, completely intact, and were marked for pathology. IMPRESSION: Specimen radiograph of the left breast. Electronically Signed   By: Lovey Newcomer M.D.   On: 04/02/2021 15:31  MM Breast Surgical Specimen  Result Date: 04/02/2021 CLINICAL DATA:  Patient status post left axillary targeted dissection. EXAM: SPECIMEN RADIOGRAPH OF THE LEFT AXILLA COMPARISON:  Previous exam(s). FINDINGS: Status post excision of the LEFT AXILLA. The radioactive seed and biopsy marker clip are present, completely intact, and were marked for pathology. IMPRESSION: Specimen radiograph of the LEFT AXILLA Electronically Signed   By: Lovey Newcomer M.D.   On: 04/02/2021 14:59  [place] LT RADIOACTIVE SEED LOC  Result Date: 04/01/2021 CLINICAL DATA:  Status post patient presents for placement of radioactive seed in the LEFT axilla and LEFT breast. EXAM: ULTRASOUND GUIDED RADIOACTIVE SEED LOCALIZATION OF THE LEFT BREAST COMPARISON:  Previous exam(s). FINDINGS: Patient presents for radioactive seed localization prior to LEFT lumpectomy and targeted axillary dissection. I met with the patient and we discussed the procedure of seed localization including benefits and alternatives. We discussed the high likelihood of a successful procedure. We discussed the risks of the procedure including infection, bleeding, tissue injury and further surgery. We discussed the low dose of radioactivity involved in the procedure. Informed, written consent was given. The usual time-out protocol was performed immediately prior to the procedure Site 1: Using mammographic guidance, sterile technique, 1% lidocaine and an I-125 radioactive seed, the mass marked with a ribbon shaped clip  in the 2 o'clock location of the LEFT breast was localized using a LATERAL to MEDIAL approach. The follow-up mammogram images confirm the seed in the expected location and were marked for Dr. Marlou Starks. Follow-up survey of the patient confirms presence of the radioactive seed. Order number of I-125 seed:  [id number]. Total activity:  6.010 millicuries reference Date: 03/16/2021 Site 2: Using mammographic guidance, sterile technique, 1% lidocaine and an I-125 radioactive seed, the tribell clip in the enlarged LOWER LEFT axillary lymph node was localized using a LATERAL approach. The follow-up mammogram images confirm the seed in the expected location and were marked for Dr. Marlou Starks. Follow-up survey of the patient confirms presence of the radioactive seed. Order number of I-125 seed:  [id number]. Total activity:  2.202 millicuries reference Date: 03/16/2021 The patient tolerated the procedure well and was released from the [facility]. She was given instructions regarding seed removal. IMPRESSION: Radioactive seed localization LEFT breast and LEFT axilla. No apparent complications. Electronically Signed   By: Nolon Nations M.D.   On: 04/01/2021 14:17  [place] LT RADIOACTIVE SEED LOC  Result Date: 04/01/2021 CLINICAL DATA:  Status post patient presents for placement of radioactive seed in the LEFT axilla and LEFT breast. EXAM: ULTRASOUND GUIDED RADIOACTIVE SEED LOCALIZATION OF THE LEFT BREAST COMPARISON:  Previous exam(s). FINDINGS: Patient presents for radioactive seed  localization prior to LEFT lumpectomy and targeted axillary dissection. I met with the patient and we discussed the procedure of seed localization including benefits and alternatives. We discussed the high likelihood of a successful procedure. We discussed the risks of the procedure including infection, bleeding, tissue injury and further surgery. We discussed the low dose of radioactivity involved in the procedure. Informed, written consent was given. The  usual time-out protocol was performed immediately prior to the procedure Site 1: Using mammographic guidance, sterile technique, 1% lidocaine and an I-125 radioactive seed, the mass marked with a ribbon shaped clip in the 2 o'clock location of the LEFT breast was localized using a LATERAL to MEDIAL approach. The follow-up mammogram images confirm the seed in the expected location and were marked for Dr. Marlou Starks. Follow-up survey of the patient confirms presence of the radioactive seed. Order number of I-125 seed:  [id number]. Total activity:  0.388 millicuries reference Date: 03/16/2021 Site 2: Using mammographic guidance, sterile technique, 1% lidocaine and an I-125 radioactive seed, the tribell clip in the enlarged LOWER LEFT axillary lymph node was localized using a LATERAL approach. The follow-up mammogram images confirm the seed in the expected location and were marked for Dr. Marlou Starks. Follow-up survey of the patient confirms presence of the radioactive seed. Order number of I-125 seed:  [id number]. Total activity:  1.791 millicuries reference Date: 03/16/2021 The patient tolerated the procedure well and was released from the [facility]. She was given instructions regarding seed removal. IMPRESSION: Radioactive seed localization LEFT breast and LEFT axilla. No apparent complications. Electronically Signed   By: Nolon Nations M.D.   On: 04/01/2021 14:17  MM CLIP PLACEMENT LEFT  Result Date: 04/01/2021 CLINICAL DATA:  Placement of radioactive seed in the 2 o'clock location of the LEFT breast. Status post placement of radioactive recently biopsied LEFT axillary lymph node. EXAM: DIAGNOSTIC LEFT MAMMOGRAM POST ULTRASOUND-GUIDED RADIOACTIVE SEED PLACEMENT x2 COMPARISON:  Previous exam(s). FINDINGS: Mammographic images were obtained following ultrasound-guided radioactive seed placement. These demonstrate radioactive seed adjacent to ribbon shaped clip in the 2 o'clock location of the LEFT breast. Radioactive seed is  adjacent to tribell clip in the LEFT axilla, within an enlarged lymph node. IMPRESSION: Appropriate location of radioactive seeds in the LEFT breast and LEFT axilla. Final Assessment: Post Procedure Mammograms for Seed Placement Electronically Signed   By: Nolon Nations M.D.   On: 04/01/2021 14:12   Impression/Plan: We discussed adjuvant radiotherapy today.  I recommend 6 weeks of adjuvant radiation therapy to the left breast and regional nodes in order to reduce risk of locoregional recurrence by two thirds.  I reviewed the logistics, benefits, risks, and potential side effects of this treatment in detail. Risks may include but not necessary be limited to acute and late injury tissue in the radiation fields such as skin irritation (change in color/pigmentation, itching, dryness, pain, peeling). She may experience fatigue. We also discussed possible risk of long term cosmetic changes or scar tissue. There is also a smaller risk for lung toxicity, cardiac toxicity, brachial plexopathy, lymphedema, musculoskeletal changes, rib fragility or induction of a second malignancy, late chronic non-healing soft tissue wound.    The patient asked good questions which I answered to her satisfaction. She is enthusiastic about proceeding with treatment. A consent form has been signed and placed in her chart.   Proceed with treatment planning today, start treatment next week.  On date of service, in total, I spent 25 minutes on this encounter. Patient was seen in person.  _____________________________________   Eppie Gibson, MD  This document serves as a record of services personally performed by Eppie Gibson, MD. It was created on her behalf by Roney Mans, a trained medical scribe. The creation of this record is based on the scribe's personal observations and the provider's statements to them. This document has been checked and approved by the attending provider.

## 2021-04-29 ENCOUNTER — Other Ambulatory Visit: Payer: Self-pay

## 2021-04-29 ENCOUNTER — Ambulatory Visit
Admission: RE | Admit: 2021-04-29 | Discharge: 2021-04-29 | Disposition: A | Discharge status: Home / Self Care | Payer: Medicare Other | Source: Ambulatory Visit | Attending: Radiation Oncology | Admitting: Radiation Oncology

## 2021-04-29 ENCOUNTER — Encounter: Payer: Self-pay | Admitting: Radiation Oncology

## 2021-04-29 VITALS — BP 192/85 | HR 58 | Temp 97.2°F | Resp 20 | Ht 67.0 in | Wt 263.5 lb

## 2021-04-29 DIAGNOSIS — Z79899 Other long term (current) drug therapy: Secondary | ICD-10-CM | POA: Insufficient documentation

## 2021-04-29 DIAGNOSIS — C50412 Malignant neoplasm of upper-outer quadrant of left female breast: Secondary | ICD-10-CM

## 2021-04-29 DIAGNOSIS — Z170 Estrogen receptor positive status [ER+]: Secondary | ICD-10-CM | POA: Insufficient documentation

## 2021-04-29 DIAGNOSIS — Z510 Encounter for antineoplastic radiation therapy: Secondary | ICD-10-CM | POA: Diagnosis not present

## 2021-05-01 ENCOUNTER — Encounter: Payer: Self-pay | Admitting: Radiation Oncology

## 2021-05-04 ENCOUNTER — Encounter: Payer: Self-pay | Admitting: *Deleted

## 2021-05-05 ENCOUNTER — Telehealth: Payer: Self-pay | Admitting: Hematology and Oncology

## 2021-05-05 DIAGNOSIS — C50412 Malignant neoplasm of upper-outer quadrant of left female breast: Secondary | ICD-10-CM | POA: Diagnosis not present

## 2021-05-05 DIAGNOSIS — Z510 Encounter for antineoplastic radiation therapy: Secondary | ICD-10-CM | POA: Diagnosis not present

## 2021-05-05 DIAGNOSIS — Z170 Estrogen receptor positive status [ER+]: Secondary | ICD-10-CM | POA: Diagnosis not present

## 2021-05-05 NOTE — Telephone Encounter (Signed)
Scheduled per sch msg. Called and left msg. Mailed printout  

## 2021-05-06 ENCOUNTER — Ambulatory Visit
Admission: RE | Admit: 2021-05-06 | Discharge: 2021-05-06 | Disposition: A | Discharge status: Home / Self Care | Payer: Medicare Other | Source: Ambulatory Visit | Attending: Radiation Oncology | Admitting: Radiation Oncology

## 2021-05-06 DIAGNOSIS — Z510 Encounter for antineoplastic radiation therapy: Secondary | ICD-10-CM | POA: Diagnosis not present

## 2021-05-06 DIAGNOSIS — Z170 Estrogen receptor positive status [ER+]: Secondary | ICD-10-CM | POA: Diagnosis not present

## 2021-05-06 DIAGNOSIS — C50412 Malignant neoplasm of upper-outer quadrant of left female breast: Secondary | ICD-10-CM | POA: Diagnosis not present

## 2021-05-07 ENCOUNTER — Other Ambulatory Visit: Payer: Self-pay

## 2021-05-07 ENCOUNTER — Ambulatory Visit
Admission: RE | Admit: 2021-05-07 | Discharge: 2021-05-07 | Disposition: A | Discharge status: Home / Self Care | Payer: Medicare Other | Source: Ambulatory Visit | Attending: Radiation Oncology | Admitting: Radiation Oncology

## 2021-05-07 DIAGNOSIS — Z510 Encounter for antineoplastic radiation therapy: Secondary | ICD-10-CM | POA: Diagnosis not present

## 2021-05-07 DIAGNOSIS — C50412 Malignant neoplasm of upper-outer quadrant of left female breast: Secondary | ICD-10-CM | POA: Diagnosis not present

## 2021-05-07 DIAGNOSIS — Z170 Estrogen receptor positive status [ER+]: Secondary | ICD-10-CM | POA: Diagnosis not present

## 2021-05-08 ENCOUNTER — Ambulatory Visit
Admission: RE | Admit: 2021-05-08 | Discharge: 2021-05-08 | Disposition: A | Discharge status: Home / Self Care | Payer: Medicare Other | Source: Ambulatory Visit | Attending: Radiation Oncology | Admitting: Radiation Oncology

## 2021-05-08 DIAGNOSIS — C50412 Malignant neoplasm of upper-outer quadrant of left female breast: Secondary | ICD-10-CM | POA: Diagnosis not present

## 2021-05-08 DIAGNOSIS — Z510 Encounter for antineoplastic radiation therapy: Secondary | ICD-10-CM | POA: Diagnosis not present

## 2021-05-08 DIAGNOSIS — Z170 Estrogen receptor positive status [ER+]: Secondary | ICD-10-CM | POA: Diagnosis not present

## 2021-05-11 ENCOUNTER — Other Ambulatory Visit: Payer: Self-pay

## 2021-05-11 ENCOUNTER — Ambulatory Visit
Admission: RE | Admit: 2021-05-11 | Discharge: 2021-05-11 | Disposition: A | Discharge status: Home / Self Care | Payer: Medicare Other | Source: Ambulatory Visit | Attending: Radiation Oncology | Admitting: Radiation Oncology

## 2021-05-11 DIAGNOSIS — Z170 Estrogen receptor positive status [ER+]: Secondary | ICD-10-CM | POA: Diagnosis not present

## 2021-05-11 DIAGNOSIS — C50412 Malignant neoplasm of upper-outer quadrant of left female breast: Secondary | ICD-10-CM | POA: Insufficient documentation

## 2021-05-11 MED ORDER — ALRA NON-METALLIC DEODORANT (RAD-ONC)
1.0000 "application " | Freq: Once | TOPICAL | Status: AC
  Administered 2021-05-11: 1 via TOPICAL

## 2021-05-11 MED ORDER — RADIAPLEXRX EX GEL: Freq: Once | CUTANEOUS | Status: AC

## 2021-05-11 NOTE — Progress Notes (Signed)

## 2021-05-12 ENCOUNTER — Ambulatory Visit
Admission: RE | Admit: 2021-05-12 | Discharge: 2021-05-12 | Disposition: A | Discharge status: Home / Self Care | Payer: Medicare Other | Source: Ambulatory Visit | Attending: Radiation Oncology | Admitting: Radiation Oncology

## 2021-05-12 DIAGNOSIS — Z170 Estrogen receptor positive status [ER+]: Secondary | ICD-10-CM | POA: Diagnosis not present

## 2021-05-12 DIAGNOSIS — C50412 Malignant neoplasm of upper-outer quadrant of left female breast: Secondary | ICD-10-CM | POA: Diagnosis not present

## 2021-05-13 ENCOUNTER — Ambulatory Visit
Admission: RE | Admit: 2021-05-13 | Discharge: 2021-05-13 | Disposition: A | Discharge status: Home / Self Care | Payer: Medicare Other | Source: Ambulatory Visit | Attending: Radiation Oncology | Admitting: Radiation Oncology

## 2021-05-13 ENCOUNTER — Other Ambulatory Visit: Payer: Self-pay

## 2021-05-13 DIAGNOSIS — C50412 Malignant neoplasm of upper-outer quadrant of left female breast: Secondary | ICD-10-CM | POA: Diagnosis not present

## 2021-05-13 DIAGNOSIS — Z170 Estrogen receptor positive status [ER+]: Secondary | ICD-10-CM | POA: Diagnosis not present

## 2021-05-14 ENCOUNTER — Ambulatory Visit
Admission: RE | Admit: 2021-05-14 | Discharge: 2021-05-14 | Disposition: A | Discharge status: Home / Self Care | Payer: Medicare Other | Source: Ambulatory Visit | Attending: Radiation Oncology | Admitting: Radiation Oncology

## 2021-05-14 DIAGNOSIS — Z170 Estrogen receptor positive status [ER+]: Secondary | ICD-10-CM | POA: Diagnosis not present

## 2021-05-14 DIAGNOSIS — C50412 Malignant neoplasm of upper-outer quadrant of left female breast: Secondary | ICD-10-CM | POA: Diagnosis not present

## 2021-05-15 ENCOUNTER — Ambulatory Visit
Admission: RE | Admit: 2021-05-15 | Discharge: 2021-05-15 | Disposition: A | Discharge status: Home / Self Care | Payer: Medicare Other | Source: Ambulatory Visit | Attending: Radiation Oncology | Admitting: Radiation Oncology

## 2021-05-15 ENCOUNTER — Other Ambulatory Visit: Payer: Self-pay

## 2021-05-15 DIAGNOSIS — Z170 Estrogen receptor positive status [ER+]: Secondary | ICD-10-CM | POA: Diagnosis not present

## 2021-05-15 DIAGNOSIS — C50412 Malignant neoplasm of upper-outer quadrant of left female breast: Secondary | ICD-10-CM | POA: Diagnosis not present

## 2021-05-18 ENCOUNTER — Other Ambulatory Visit: Payer: Self-pay

## 2021-05-18 ENCOUNTER — Ambulatory Visit
Admission: RE | Admit: 2021-05-18 | Discharge: 2021-05-18 | Disposition: A | Discharge status: Home / Self Care | Payer: Medicare Other | Source: Ambulatory Visit | Attending: Radiation Oncology | Admitting: Radiation Oncology

## 2021-05-18 DIAGNOSIS — Z170 Estrogen receptor positive status [ER+]: Secondary | ICD-10-CM | POA: Diagnosis not present

## 2021-05-18 DIAGNOSIS — C50412 Malignant neoplasm of upper-outer quadrant of left female breast: Secondary | ICD-10-CM | POA: Diagnosis not present

## 2021-05-19 ENCOUNTER — Encounter: Payer: Self-pay | Admitting: Rehabilitation

## 2021-05-19 ENCOUNTER — Ambulatory Visit
Admission: RE | Admit: 2021-05-19 | Discharge: 2021-05-19 | Disposition: A | Discharge status: Home / Self Care | Payer: Medicare Other | Source: Ambulatory Visit | Attending: Radiation Oncology | Admitting: Radiation Oncology

## 2021-05-19 ENCOUNTER — Ambulatory Visit: Payer: Medicare Other | Attending: General Surgery | Admitting: Rehabilitation

## 2021-05-19 DIAGNOSIS — Z170 Estrogen receptor positive status [ER+]: Secondary | ICD-10-CM | POA: Diagnosis not present

## 2021-05-19 DIAGNOSIS — C50412 Malignant neoplasm of upper-outer quadrant of left female breast: Secondary | ICD-10-CM | POA: Diagnosis not present

## 2021-05-19 DIAGNOSIS — R293 Abnormal posture: Secondary | ICD-10-CM | POA: Insufficient documentation

## 2021-05-19 NOTE — Therapy (Signed)
[facility] @ [place], [place], [address] Phone: [phone]   Fax:  [phone]  Physical Therapy Treatment  Patient Details  Name: Christy Strickland MRN: [id number] Date of Birth: 1951/03/27 Referring Provider (PT): Dr. Autumn Messing   Encounter Date: 05/19/2021   PT End of Session - 05/19/21 1343     Visit Number 2    Number of Visits 2    Date for PT Re-Evaluation 05/20/21    PT Start Time 6256    PT Stop Time 1336    PT Time Calculation (min) 30 min    Activity Tolerance Patient tolerated treatment well    Behavior During Therapy [facility] for tasks assessed/performed             Past Medical History:  Diagnosis Date   Arthritis    Breast cancer ([place])    left breast IDC   Hypertension    Neuromuscular disorder ([place])    neuropathy in feet   Seasonal allergies    Sleep apnea    uses CPAP nightly    Past Surgical History:  Procedure Laterality Date   BACK SURGERY     lower back   bilarteral knee replacements Bilateral    BREAST LUMPECTOMY WITH RADIOACTIVE SEED AND SENTINEL LYMPH NODE BIOPSY Left 04/02/2021   Procedure: LEFT BREAST LUMPECTOMY WITH RADIOACTIVE SEED AND SENTINEL LYMPH NODE BIOPSY;  Surgeon: Jovita Kussmaul, MD;  Location: [facility];  Service: General;  Laterality: Left;  120 MINUTES ROOM 2 IQ   CESAREAN SECTION     x 2   COLON SURGERY     DIAGNOSTIC LAPAROSCOPY     benign mass   DILATATION & CURETTAGE/HYSTEROSCOPY WITH MYOSURE  04/27/2018   Procedure: DILATATION & CURETTAGE/HYSTEROSCOPY WITH MYOSURE;  Surgeon: Janyth Pupa, DO;  Location: [facility];  Service: Gynecology;;   DILATION AND CURETTAGE OF UTERUS     x 2    RADIOACTIVE SEED GUIDED AXILLARY SENTINEL LYMPH NODE Left 04/02/2021   Procedure: RADIOACTIVE SEED GUIDED AXILLARY SENTINEL LYMPH NODE DISSECTION;  Surgeon: Jovita Kussmaul, MD;  Location: [facility];  Service: General;  Laterality: Left;    remove pituitary gland      There were no vitals filed for this visit.   Subjective Assessment - 05/19/21 1309     Subjective I am doing Okay. I think I am doing fine.    Pertinent History Patient was diagnosed on 03/04/2021 with left grade II invasive ductal carcinoma breast cancer. It measures 1.1 cm and is located in the upper outer quadrant. It is ER/PR positive and HER2 negative with a Ki67 of 15%. She has baseline bilateral feet neuropathy. She has a positive axillary lymph node. Lt lumpectomy with 1/4 lymph nodes positive on 04/02/21. Currently in radiation.    Currently in Pain? No/denies                [facility] PT Assessment - 05/19/21 0001       AROM   Left Shoulder Extension 70 Degrees    Left Shoulder Flexion 155 Degrees    Left Shoulder ABduction 172 Degrees    Left Shoulder External Rotation 95 Degrees               LYMPHEDEMA/ONCOLOGY QUESTIONNAIRE - 05/19/21 0001       Left Upper Extremity Lymphedema   10 cm Proximal to Olecranon Process 34.5 cm    Olecranon Process 26.5 cm  10 cm Proximal to Ulnar Styloid Process 22.9 cm    Just Proximal to Ulnar Styloid Process 16.5 cm    Across Hand at [employer] 20 cm    At [place] of 2nd Digit 6.5 cm                                 PT Education - 05/19/21 1343     Education Details post op follow up instructions, continue walking, when to return    Person(s) Educated Patient    Methods Explanation;Handout    Comprehension Verbalized understanding                 PT Long Term Goals - 05/19/21 1346       PT LONG TERM GOAL #1   Title Patient will demonstrate she has regained full shoulder ROM and function post operatively compared to baselines.    Status Achieved                   Plan - 05/19/21 1343     Clinical Impression Statement Pt underwent a left lumpectomy with 4 lymph nodes removed.  1/4 positive.  Pt has returned to even better than baseline AROM  without pain or pulling.  Is able to tolerate radiation well, and has good incision healing.  Post op measurements taken with education on risk reduction, lymphedema, ABC class, continuing current stretches throughout and after radiation, and scar massage post radiation.  pt will not need any more PT visits but will return for SOZO    Stability/Clinical Decision Making Stable/Uncomplicated    Clinical Decision Making Low    Rehab Potential Excellent    PT Treatment/Interventions ADLs/Self Care Home Management;Therapeutic exercise;Patient/family education    PT Next Visit Plan SOZO only    Consulted and Agree with Plan of Care Patient;Family member/caregiver    Family Member Consulted husband             Patient will benefit from skilled therapeutic intervention in order to improve the following deficits and impairments:  Postural dysfunction, Decreased knowledge of precautions  Visit Diagnosis: Malignant neoplasm of upper-outer quadrant of left breast in female, estrogen receptor positive ([place])  Abnormal posture     Problem List Patient Active Problem List   Diagnosis Date Noted   Malignant neoplasm of upper-outer quadrant of left breast in female, estrogen receptor positive ([place]) 03/23/2021    Stark Bray, PT 05/19/2021, 1:47 PM  [facility] @ [place], [place], [address] Phone: [phone]   Fax:  [phone]  Name: Christy Strickland MRN: [id number] Date of Birth: 1951-06-26  PHYSICAL THERAPY DISCHARGE SUMMARY  Visits from Start of Care: 2  Current functional level related to goals / functional outcomes: All met   Remaining deficits: Lymphedema risk   Education / Equipment: HEP Plan: Patient agrees to discharge.  Patient goals were not met. Patient is being discharged due to meeting the stated rehab goals.

## 2021-05-19 NOTE — Patient Instructions (Signed)
     [facility]  [address], [address]  [place] [address]  [phone]  After Breast Cancer Class It is recommended you attend the ABC class to be educated on lymphedema risk reduction. This class is free of charge and lasts for 1 hour. It is a 1-time class.   Scar massage - when not sensitive from radiation   Compression garment - only if needed   Home exercise Program- continue as you are doing   Follow up PT: It is recommended you return every 3 months for the first 3 years following surgery to be assessed on the SOZO machine for an L-Dex score. This helps prevent clinically significant lymphedema in 95% of patients. These follow up screens are 10 minute appointments that you are not billed for. 07/13/21 at 10am

## 2021-05-20 ENCOUNTER — Ambulatory Visit
Admission: RE | Admit: 2021-05-20 | Discharge: 2021-05-20 | Disposition: A | Discharge status: Home / Self Care | Payer: Medicare Other | Source: Ambulatory Visit | Attending: Radiation Oncology | Admitting: Radiation Oncology

## 2021-05-20 ENCOUNTER — Other Ambulatory Visit: Payer: Self-pay

## 2021-05-20 DIAGNOSIS — C50412 Malignant neoplasm of upper-outer quadrant of left female breast: Secondary | ICD-10-CM | POA: Diagnosis not present

## 2021-05-20 DIAGNOSIS — Z170 Estrogen receptor positive status [ER+]: Secondary | ICD-10-CM | POA: Diagnosis not present

## 2021-05-21 ENCOUNTER — Ambulatory Visit
Admission: RE | Admit: 2021-05-21 | Discharge: 2021-05-21 | Disposition: A | Discharge status: Home / Self Care | Payer: Medicare Other | Source: Ambulatory Visit | Attending: Radiation Oncology | Admitting: Radiation Oncology

## 2021-05-21 ENCOUNTER — Encounter: Payer: Self-pay | Admitting: *Deleted

## 2021-05-21 DIAGNOSIS — C50412 Malignant neoplasm of upper-outer quadrant of left female breast: Secondary | ICD-10-CM | POA: Diagnosis not present

## 2021-05-21 DIAGNOSIS — Z170 Estrogen receptor positive status [ER+]: Secondary | ICD-10-CM | POA: Diagnosis not present

## 2021-05-22 ENCOUNTER — Ambulatory Visit
Admission: RE | Admit: 2021-05-22 | Discharge: 2021-05-22 | Disposition: A | Discharge status: Home / Self Care | Payer: Medicare Other | Source: Ambulatory Visit | Attending: Radiation Oncology | Admitting: Radiation Oncology

## 2021-05-22 ENCOUNTER — Other Ambulatory Visit: Payer: Self-pay

## 2021-05-22 DIAGNOSIS — Z170 Estrogen receptor positive status [ER+]: Secondary | ICD-10-CM | POA: Diagnosis not present

## 2021-05-22 DIAGNOSIS — C50412 Malignant neoplasm of upper-outer quadrant of left female breast: Secondary | ICD-10-CM | POA: Diagnosis not present

## 2021-05-25 ENCOUNTER — Other Ambulatory Visit: Payer: Self-pay

## 2021-05-25 ENCOUNTER — Ambulatory Visit
Admission: RE | Admit: 2021-05-25 | Discharge: 2021-05-25 | Disposition: A | Discharge status: Home / Self Care | Payer: Medicare Other | Source: Ambulatory Visit | Attending: Radiation Oncology | Admitting: Radiation Oncology

## 2021-05-25 DIAGNOSIS — Z170 Estrogen receptor positive status [ER+]: Secondary | ICD-10-CM | POA: Diagnosis not present

## 2021-05-25 DIAGNOSIS — C50412 Malignant neoplasm of upper-outer quadrant of left female breast: Secondary | ICD-10-CM | POA: Diagnosis not present

## 2021-05-26 ENCOUNTER — Other Ambulatory Visit: Payer: Self-pay

## 2021-05-26 ENCOUNTER — Ambulatory Visit
Admission: RE | Admit: 2021-05-26 | Discharge: 2021-05-26 | Disposition: A | Discharge status: Home / Self Care | Payer: Medicare Other | Source: Ambulatory Visit | Attending: Radiation Oncology | Admitting: Radiation Oncology

## 2021-05-26 DIAGNOSIS — C50412 Malignant neoplasm of upper-outer quadrant of left female breast: Secondary | ICD-10-CM | POA: Diagnosis not present

## 2021-05-26 DIAGNOSIS — Z170 Estrogen receptor positive status [ER+]: Secondary | ICD-10-CM | POA: Diagnosis not present

## 2021-05-27 ENCOUNTER — Ambulatory Visit
Admission: RE | Admit: 2021-05-27 | Discharge: 2021-05-27 | Disposition: A | Discharge status: Home / Self Care | Payer: Medicare Other | Source: Ambulatory Visit | Attending: Radiation Oncology | Admitting: Radiation Oncology

## 2021-05-27 ENCOUNTER — Encounter: Payer: Self-pay | Admitting: General Practice

## 2021-05-27 DIAGNOSIS — C50412 Malignant neoplasm of upper-outer quadrant of left female breast: Secondary | ICD-10-CM | POA: Diagnosis not present

## 2021-05-27 DIAGNOSIS — Z170 Estrogen receptor positive status [ER+]: Secondary | ICD-10-CM | POA: Diagnosis not present

## 2021-05-27 NOTE — Progress Notes (Signed)
[facility] Note  Referred by Willodean Rosenthal for pastoral check-in. Reached Christy Strickland by phone. She reports that she knows that the radiation progression "will get worse before it gets better" and was optimistic about the ultimate outcome and recovery. She states that she has no other needs at this time, but was pleased to receive call and to realize that Spiritual Care is located so close to radiation--she plans to phone as needed/desired.    [place], [place], [facility] Pager [phone] Voicemail [phone]

## 2021-05-28 ENCOUNTER — Ambulatory Visit
Admission: RE | Admit: 2021-05-28 | Discharge: 2021-05-28 | Disposition: A | Discharge status: Home / Self Care | Payer: Medicare Other | Source: Ambulatory Visit | Attending: Radiation Oncology | Admitting: Radiation Oncology

## 2021-05-28 ENCOUNTER — Other Ambulatory Visit: Payer: Self-pay

## 2021-05-28 DIAGNOSIS — Z170 Estrogen receptor positive status [ER+]: Secondary | ICD-10-CM | POA: Diagnosis not present

## 2021-05-28 DIAGNOSIS — C50412 Malignant neoplasm of upper-outer quadrant of left female breast: Secondary | ICD-10-CM | POA: Diagnosis not present

## 2021-05-29 ENCOUNTER — Ambulatory Visit
Admission: RE | Admit: 2021-05-29 | Discharge: 2021-05-29 | Disposition: A | Discharge status: Home / Self Care | Payer: Medicare Other | Source: Ambulatory Visit | Attending: Radiation Oncology | Admitting: Radiation Oncology

## 2021-05-29 DIAGNOSIS — Z170 Estrogen receptor positive status [ER+]: Secondary | ICD-10-CM | POA: Diagnosis not present

## 2021-05-29 DIAGNOSIS — C50412 Malignant neoplasm of upper-outer quadrant of left female breast: Secondary | ICD-10-CM | POA: Diagnosis not present

## 2021-06-01 ENCOUNTER — Other Ambulatory Visit: Payer: Self-pay

## 2021-06-01 ENCOUNTER — Ambulatory Visit
Admission: RE | Admit: 2021-06-01 | Discharge: 2021-06-01 | Disposition: A | Discharge status: Home / Self Care | Payer: Medicare Other | Source: Ambulatory Visit | Attending: Radiation Oncology | Admitting: Radiation Oncology

## 2021-06-01 DIAGNOSIS — C50412 Malignant neoplasm of upper-outer quadrant of left female breast: Secondary | ICD-10-CM | POA: Diagnosis not present

## 2021-06-01 DIAGNOSIS — Z170 Estrogen receptor positive status [ER+]: Secondary | ICD-10-CM | POA: Diagnosis not present

## 2021-06-02 ENCOUNTER — Ambulatory Visit
Admission: RE | Admit: 2021-06-02 | Discharge: 2021-06-02 | Disposition: A | Discharge status: Home / Self Care | Payer: Medicare Other | Source: Ambulatory Visit | Attending: Radiation Oncology | Admitting: Radiation Oncology

## 2021-06-02 DIAGNOSIS — Z170 Estrogen receptor positive status [ER+]: Secondary | ICD-10-CM | POA: Diagnosis not present

## 2021-06-02 DIAGNOSIS — C50412 Malignant neoplasm of upper-outer quadrant of left female breast: Secondary | ICD-10-CM | POA: Diagnosis not present

## 2021-06-03 ENCOUNTER — Other Ambulatory Visit: Payer: Self-pay

## 2021-06-03 ENCOUNTER — Ambulatory Visit
Admission: RE | Admit: 2021-06-03 | Discharge: 2021-06-03 | Disposition: A | Discharge status: Home / Self Care | Payer: Medicare Other | Source: Ambulatory Visit | Attending: Radiation Oncology | Admitting: Radiation Oncology

## 2021-06-03 DIAGNOSIS — C50412 Malignant neoplasm of upper-outer quadrant of left female breast: Secondary | ICD-10-CM | POA: Diagnosis not present

## 2021-06-03 DIAGNOSIS — Z170 Estrogen receptor positive status [ER+]: Secondary | ICD-10-CM | POA: Diagnosis not present

## 2021-06-04 ENCOUNTER — Ambulatory Visit
Admission: RE | Admit: 2021-06-04 | Discharge: 2021-06-04 | Disposition: A | Discharge status: Home / Self Care | Payer: Medicare Other | Source: Ambulatory Visit | Attending: Radiation Oncology | Admitting: Radiation Oncology

## 2021-06-04 DIAGNOSIS — C50412 Malignant neoplasm of upper-outer quadrant of left female breast: Secondary | ICD-10-CM | POA: Diagnosis not present

## 2021-06-04 DIAGNOSIS — Z170 Estrogen receptor positive status [ER+]: Secondary | ICD-10-CM | POA: Diagnosis not present

## 2021-06-05 ENCOUNTER — Ambulatory Visit
Admission: RE | Admit: 2021-06-05 | Discharge: 2021-06-05 | Disposition: A | Discharge status: Home / Self Care | Payer: Medicare Other | Source: Ambulatory Visit | Attending: Radiation Oncology | Admitting: Radiation Oncology

## 2021-06-05 ENCOUNTER — Other Ambulatory Visit: Payer: Self-pay

## 2021-06-05 DIAGNOSIS — C50412 Malignant neoplasm of upper-outer quadrant of left female breast: Secondary | ICD-10-CM | POA: Diagnosis not present

## 2021-06-05 DIAGNOSIS — Z170 Estrogen receptor positive status [ER+]: Secondary | ICD-10-CM | POA: Diagnosis not present

## 2021-06-08 ENCOUNTER — Ambulatory Visit
Admission: RE | Admit: 2021-06-08 | Discharge: 2021-06-08 | Disposition: A | Discharge status: Home / Self Care | Payer: Medicare Other | Source: Ambulatory Visit | Attending: Radiation Oncology | Admitting: Radiation Oncology

## 2021-06-08 ENCOUNTER — Ambulatory Visit: Payer: Medicare Other | Admitting: Radiation Oncology

## 2021-06-08 ENCOUNTER — Other Ambulatory Visit: Payer: Self-pay

## 2021-06-08 DIAGNOSIS — Z170 Estrogen receptor positive status [ER+]: Secondary | ICD-10-CM | POA: Diagnosis not present

## 2021-06-08 DIAGNOSIS — C50412 Malignant neoplasm of upper-outer quadrant of left female breast: Secondary | ICD-10-CM | POA: Diagnosis not present

## 2021-06-09 ENCOUNTER — Ambulatory Visit
Admission: RE | Admit: 2021-06-09 | Discharge: 2021-06-09 | Disposition: A | Discharge status: Home / Self Care | Payer: Medicare Other | Source: Ambulatory Visit | Attending: Radiation Oncology | Admitting: Radiation Oncology

## 2021-06-09 ENCOUNTER — Encounter: Payer: Self-pay | Admitting: Radiation Oncology

## 2021-06-09 DIAGNOSIS — C50412 Malignant neoplasm of upper-outer quadrant of left female breast: Secondary | ICD-10-CM | POA: Insufficient documentation

## 2021-06-09 DIAGNOSIS — Z170 Estrogen receptor positive status [ER+]: Secondary | ICD-10-CM | POA: Insufficient documentation

## 2021-06-10 ENCOUNTER — Ambulatory Visit: Payer: Medicare Other

## 2021-06-10 ENCOUNTER — Telehealth: Payer: Self-pay

## 2021-06-10 NOTE — Telephone Encounter (Signed)
Yesterday, patient asked for clarification as to why 06/09/21 was her last session, and she did not need to receive her boost treatments. Informed her I would check with Dr. Isidore Moos and call her with a update. Patient verbalized understanding and agreement.   Relayed patient's question to Dr. Isidore Moos, and was advised to let patient know that: "Because her margins were so wide and because her tumor was quite small, I determined that the risks of a boost outweighed the possible benefits! Congratulate her and assure her she is done! She will need 1 mo f/u"   Called and spoke with patient this morning. Relayed Dr. Pearlie Oyster instructions, as well as 1 month F/U date/time. Patient verbalized understanding and appreciation of call. No other needs identified at this time, but patient knows she can call me directly as needed

## 2021-06-11 ENCOUNTER — Ambulatory Visit: Payer: Medicare Other

## 2021-06-12 ENCOUNTER — Ambulatory Visit: Payer: Medicare Other

## 2021-06-13 NOTE — Progress Notes (Incomplete)
Patient Care Team: Orpah Melter, MD as PCP - General (Family Medicine) Mauro Kaufmann, RN as Oncology Nurse Navigator Rockwell Germany, RN as Oncology Nurse Navigator Jovita Kussmaul, MD as Consulting Physician (General Surgery) Nicholas Lose, MD as Consulting Physician (Hematology and Oncology) Eppie Gibson, MD as Attending Physician (Radiation Oncology)  DIAGNOSIS: No diagnosis found.  SUMMARY OF ONCOLOGIC HISTORY: Oncology History  Malignant neoplasm of upper-outer quadrant of left breast in female, estrogen receptor positive ([place])  03/23/2021 Initial Diagnosis   Screening mammogram: a possible mass and abnormal lymph node in the left breast. Diagnostic mammogram and [place]: suspicious 1.1 cm mass at the left breast 2 o'clock position and enlarged left axillary lymph node. 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  ER 70%, PR 40%, Ki-67 15%, HER2 negative by [facility]   03/25/2021 Cancer Staging   Staging form: Breast, AJCC 8th Edition - Clinical stage from 03/25/2021: Stage IB (cT1c, cN1, cM0, G2, ER+, PR+, HER2-) - Signed by Nicholas Lose, MD on 03/25/2021 Stage prefix: Initial diagnosis Histologic grading system: 3 grade system    04/02/2021 Surgery   Left lumpectomy: Grade 2 IDC, 1.1 cm, with intermediate grade DCIS, margins negative, 1/4 lymph nodes positive with extracapsular extension, ER 70%, PR 40%, HER2 equal vocal negative FISH, Ki-67 15%     CHIEF COMPLIANT: Follow-up of left breast cancer  INTERVAL HISTORY: Christy Strickland is a 70 y.o. with above-mentioned history of left breast cancer having undergone lumpectomy and radiation therapy. She presents to the clinic today for follow-up.   ALLERGIES:  is allergic to mushroom extract complex, amoxicillin-pot clavulanate, and meperidine hcl.  MEDICATIONS:  Current Outpatient Medications  Medication Sig Dispense Refill   cetirizine (ZYRTEC) 10 MG tablet Take 10 mg by  mouth daily.     hydrochlorothiazide (HYDRODIURIL) 25 MG tablet Take 25 mg by mouth daily.     HYDROcodone-acetaminophen (NORCO/VICODIN) 5-325 MG tablet Take 1 tablet by mouth every 6 (six) hours as needed for moderate pain or severe pain. 10 tablet 0   Multiple Vitamin (MULTIVITAMIN WITH MINERALS) TABS tablet Take 1 tablet by mouth daily.     naproxen (NAPROSYN) 500 MG tablet Take 500 mg by mouth as needed.     potassium chloride (KLOR-CON) 10 MEQ tablet Take 10 mEq by mouth 2 (two) times daily.     vitamin C (ASCORBIC ACID) 250 MG tablet Take 250 mg by mouth daily.     No current facility-administered medications for this visit.    PHYSICAL EXAMINATION: ECOG PERFORMANCE STATUS: {CHL ONC ECOG PS:[phone]}  There were no vitals filed for this visit. There were no vitals filed for this visit.  BREAST:*** No palpable masses or nodules in either right or left breasts. No palpable axillary supraclavicular or infraclavicular adenopathy no breast tenderness or nipple discharge. (exam performed in the presence of a chaperone)  LABORATORY DATA:  I have reviewed the data as listed CMP Latest Ref Rng & Units 03/30/2021 03/25/2021 04/24/2018  Glucose 70 - 99 mg/dL 88 91 87  BUN 8 - 23 mg/dL _0 Creatinine 0.44 - 1.00 mg/dL 1.04(H) 1.08(H) 1.02(H)  Sodium 135 - 145 mmol/L 140 140 137  Potassium 3.5 - 5.1 mmol/L 3.7 3.3(L) 3.5  Chloride 98 - 111 mmol/L 103 102 99  CO2 22 - 32 mmol/L _1 Calcium 8.9 - 10.3 mg/dL 10.2 10.2 10.0  Total Protein 6.5 - 8.1 g/dL - 7.8 8.4(H)  Total Bilirubin 0.3 - 1.2 mg/dL - 0.6 0.6  Alkaline Phos 38 - 126 U/L - 61 59  AST 15 - 41 U/L - 31 19  ALT 0 - 44 U/L - 25 19    Lab Results  Component Value Date   WBC 7.1 03/25/2021   HGB 14.6 03/25/2021   HCT 42.0 03/25/2021   MCV 88.6 03/25/2021   PLT 341 03/25/2021   NEUTROABS 4.5 03/25/2021    ASSESSMENT & PLAN:  No problem-specific Assessment & Plan notes found for this encounter.    No orders  of the defined types were placed in this encounter.  The patient has a good understanding of the overall plan. she agrees with it. she will call with any problems that may develop before the next visit here.  Total time spent: *** mins including face to face time and time spent for planning, charting and coordination of care  Rulon Eisenmenger, MD, MPH 06/13/2021  I, Thana Ates, am acting as scribe for Dr. Nicholas Lose.  {insert scribe attestation}

## 2021-06-15 ENCOUNTER — Ambulatory Visit: Payer: Medicare Other

## 2021-06-15 ENCOUNTER — Inpatient Hospital Stay: Payer: Medicare Other | Attending: Hematology and Oncology | Admitting: Hematology and Oncology

## 2021-06-15 ENCOUNTER — Encounter: Payer: Self-pay | Admitting: *Deleted

## 2021-06-15 DIAGNOSIS — Z170 Estrogen receptor positive status [ER+]: Secondary | ICD-10-CM | POA: Insufficient documentation

## 2021-06-15 DIAGNOSIS — Z880 Allergy status to penicillin: Secondary | ICD-10-CM | POA: Insufficient documentation

## 2021-06-15 DIAGNOSIS — Z79811 Long term (current) use of aromatase inhibitors: Secondary | ICD-10-CM | POA: Insufficient documentation

## 2021-06-15 DIAGNOSIS — C50412 Malignant neoplasm of upper-outer quadrant of left female breast: Secondary | ICD-10-CM | POA: Insufficient documentation

## 2021-06-15 DIAGNOSIS — Z79899 Other long term (current) drug therapy: Secondary | ICD-10-CM | POA: Insufficient documentation

## 2021-06-15 NOTE — Assessment & Plan Note (Deleted)
04/02/2021:Left lumpectomy: Grade 2 IDC, 1.1 cm, with intermediate grade DCIS, margins negative, 1/4 lymph nodes positive with extracapsular extension, ER 70%, PR 40%, HER2 equal vocal negative FISH, Ki-67 15%  MammaPrinttesting: Low risk  Treatment plan: 1. Adjuvant radiation: completed 06/09/21 2. Adjuvant antiestrogen therapy  Letrozole counseling: We discussed the risks and benefits of anti-estrogen therapy with aromatase inhibitors. These include but not limited to insomnia, hot flashes, mood changes, vaginal dryness, bone density loss, and weight gain. We strongly believe that the benefits far outweigh the risks. Patient understands these risks and consented to starting treatment. Planned treatment duration is 5-7 years.  RTC in 3 months for SCP visit

## 2021-06-16 ENCOUNTER — Ambulatory Visit: Payer: Medicare Other

## 2021-06-18 ENCOUNTER — Telehealth: Payer: Self-pay

## 2021-06-18 NOTE — Telephone Encounter (Signed)
Returned call to pt.  Clarified w/ pt that Dr. Lindi Adie would like to see her to assess how radiation went and discuss next steps with medications.  Pt verbalized understanding.  Appt scheduled, pt confirmed time and date work well for her and she expressed thanks.

## 2021-06-23 NOTE — Progress Notes (Signed)
Patient Care Team: Orpah Melter, MD as PCP - General (Family Medicine) Mauro Kaufmann, RN as Oncology Nurse Navigator Rockwell Germany, RN as Oncology Nurse Navigator Jovita Kussmaul, MD as Consulting Physician (General Surgery) Nicholas Lose, MD as Consulting Physician (Hematology and Oncology) Eppie Gibson, MD as Attending Physician (Radiation Oncology)  DIAGNOSIS:    ICD-10-CM   1. Malignant neoplasm of upper-outer quadrant of left breast in female, estrogen receptor positive ([place])  C50.412    Z17.0       SUMMARY OF ONCOLOGIC HISTORY: Oncology History  Malignant neoplasm of upper-outer quadrant of left breast in female, estrogen receptor positive ([place])  03/23/2021 Initial Diagnosis   Screening mammogram: a possible mass and abnormal lymph node in the left breast. Diagnostic mammogram and [place]: suspicious 1.1 cm mass at the left breast 2 o'clock position and enlarged left axillary lymph node. 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  ER 70%, PR 40%, Ki-67 15%, HER2 negative by [facility]   03/25/2021 Cancer Staging   Staging form: Breast, AJCC 8th Edition - Clinical stage from 03/25/2021: Stage IB (cT1c, cN1, cM0, G2, ER+, PR+, HER2-) - Signed by Nicholas Lose, MD on 03/25/2021 Stage prefix: Initial diagnosis Histologic grading system: 3 grade system    04/02/2021 Surgery   Left lumpectomy: Grade 2 IDC, 1.1 cm, with intermediate grade DCIS, margins negative, 1/4 lymph nodes positive with extracapsular extension, ER 70%, PR 40%, HER2 equal vocal negative FISH, Ki-67 15%   04/02/2021 Miscellaneous   Mammaprint: Low risk   05/07/2021 - 06/09/2021 Radiation Therapy   Adjuvant radiation   06/24/2021 -  Anti-estrogen oral therapy   Adjuvant antiestrogen therapy with letrozole 2.5 mg daily     CHIEF COMPLIANT: Follow-up of left breast cancer  INTERVAL HISTORY: Christy Strickland is a 70 y.o. with above-mentioned history of  left breast cancer having undergone left lumpectomy and radiation therapy. She presents to the clinic today for follow-up.  She has significant radiation dermatitis and is healing and recovering from radiation.  ALLERGIES:  is allergic to mushroom extract complex, amoxicillin-pot clavulanate, and meperidine hcl.  MEDICATIONS:  Current Outpatient Medications  Medication Sig Dispense Refill   cetirizine (ZYRTEC) 10 MG tablet Take 10 mg by mouth daily.     hydrochlorothiazide (HYDRODIURIL) 25 MG tablet Take 25 mg by mouth daily.     HYDROcodone-acetaminophen (NORCO/VICODIN) 5-325 MG tablet Take 1 tablet by mouth every 6 (six) hours as needed for moderate pain or severe pain. 10 tablet 0   Multiple Vitamin (MULTIVITAMIN WITH MINERALS) TABS tablet Take 1 tablet by mouth daily.     naproxen (NAPROSYN) 500 MG tablet Take 500 mg by mouth as needed.     potassium chloride (KLOR-CON) 10 MEQ tablet Take 10 mEq by mouth 2 (two) times daily.     vitamin C (ASCORBIC ACID) 250 MG tablet Take 250 mg by mouth daily.     No current facility-administered medications for this visit.    PHYSICAL EXAMINATION: ECOG PERFORMANCE STATUS: 1 - Symptomatic but completely ambulatory  Vitals:   06/24/21 1157  BP: (!) 174/63  Pulse: 63  Resp: 18  Temp: (!) 97.5 F (36.4 C)  SpO2: 97%   Filed Weights   06/24/21 1157  Weight: 260 lb 12.8 oz (118.3 kg)      LABORATORY DATA:  I have reviewed the data as listed CMP Latest Ref Rng & Units 03/30/2021 03/25/2021 04/24/2018  Glucose 70 - 99 mg/dL  88 91 87  BUN 8 - 23 mg/dL 12 14 16   Creatinine 0.44 - 1.00 mg/dL 1.04(H) 1.08(H) 1.02(H)  Sodium 135 - 145 mmol/L 140 140 137  Potassium 3.5 - 5.1 mmol/L 3.7 3.3(L) 3.5  Chloride 98 - 111 mmol/L 103 102 99  CO2 22 - 32 mmol/L 27 27 28   Calcium 8.9 - 10.3 mg/dL 10.2 10.2 10.0  Total Protein 6.5 - 8.1 g/dL - 7.8 8.4(H)  Total Bilirubin 0.3 - 1.2 mg/dL - 0.6 0.6  Alkaline Phos 38 - 126 U/L - 61 59  AST 15 - 41 U/L - 31  19  ALT 0 - 44 U/L - 25 19    Lab Results  Component Value Date   WBC 7.1 03/25/2021   HGB 14.6 03/25/2021   HCT 42.0 03/25/2021   MCV 88.6 03/25/2021   PLT 341 03/25/2021   NEUTROABS 4.5 03/25/2021    ASSESSMENT & PLAN:  Malignant neoplasm of upper-outer quadrant of left breast in female, estrogen receptor positive ([facility]) 04/02/2021:Left lumpectomy: Grade 2 IDC, 1.1 cm, with intermediate grade DCIS, margins negative, 1/4 lymph nodes positive with extracapsular extension, ER 70%, PR 40%, HER2 equal vocal negative FISH, Ki-67 15%  MammaPrint: Low risk luminal type a  Adjuvant radiation: 05/07/2021-06/09/2021   Treatment plan: Adjuvant antiestrogen therapy with letrozole 2.5 mg daily x5 to 7 years   Letrozole counseling: We discussed the risks and benefits of anti-estrogen therapy with aromatase inhibitors. These include but not limited to insomnia, hot flashes, mood changes, vaginal dryness, bone density loss, and weight gain. We strongly believe that the benefits far outweigh the risks. Patient understands these risks and consented to starting treatment. Planned treatment duration is 5-7 years.  Return to clinic in 3 months for survivorship care plan visit    No orders of the defined types were placed in this encounter.  The patient has a good understanding of the overall plan. she agrees with it. she will call with any problems that may develop before the next visit here.  Total time spent: 20 mins including face to face time and time spent for planning, charting and coordination of care  Rulon Eisenmenger, MD, MPH 06/24/2021  I, Thana Ates, am acting as scribe for Dr. Nicholas Lose.  I have reviewed the above documentation for accuracy and completeness, and I agree with the above.

## 2021-06-24 ENCOUNTER — Inpatient Hospital Stay (HOSPITAL_BASED_OUTPATIENT_CLINIC_OR_DEPARTMENT_OTHER): Payer: Medicare Other | Admitting: Hematology and Oncology

## 2021-06-24 ENCOUNTER — Other Ambulatory Visit: Payer: Self-pay

## 2021-06-24 VITALS — BP 174/63 | HR 63 | Temp 97.5°F | Resp 18 | Ht 67.0 in | Wt 260.8 lb

## 2021-06-24 DIAGNOSIS — Z880 Allergy status to penicillin: Secondary | ICD-10-CM | POA: Diagnosis not present

## 2021-06-24 DIAGNOSIS — Z780 Asymptomatic menopausal state: Secondary | ICD-10-CM

## 2021-06-24 DIAGNOSIS — C50412 Malignant neoplasm of upper-outer quadrant of left female breast: Secondary | ICD-10-CM

## 2021-06-24 DIAGNOSIS — Z170 Estrogen receptor positive status [ER+]: Secondary | ICD-10-CM | POA: Diagnosis not present

## 2021-06-24 DIAGNOSIS — Z79899 Other long term (current) drug therapy: Secondary | ICD-10-CM | POA: Diagnosis not present

## 2021-06-24 DIAGNOSIS — Z79811 Long term (current) use of aromatase inhibitors: Secondary | ICD-10-CM | POA: Diagnosis not present

## 2021-06-24 MED ORDER — LETROZOLE 2.5 MG PO TABS: 2.5000 mg | ORAL_TABLET | Freq: Every day | ORAL | 3 refills | Status: DC

## 2021-06-24 NOTE — Assessment & Plan Note (Signed)
04/02/2021:Left lumpectomy: Grade 2 IDC, 1.1 cm, with intermediate grade DCIS, margins negative, 1/4 lymph nodes positive with extracapsular extension, ER 70%, PR 40%, HER2 equal vocal negative FISH, Ki-67 15%  MammaPrint: Low risk luminal type a  Adjuvant radiation: 05/07/2021-06/09/2021   Treatment plan: Adjuvant antiestrogen therapy with letrozole 2.5 mg daily x5 to 7 years  Letrozole counseling: We discussed the risks and benefits of anti-estrogen therapy with aromatase inhibitors. These include but not limited to insomnia, hot flashes, mood changes, vaginal dryness, bone density loss, and weight gain. We strongly believe that the benefits far outweigh the risks. Patient understands these risks and consented to starting treatment. Planned treatment duration is 5-7 years.  Return to clinic in 3 months for survivorship care plan visit

## 2021-07-10 ENCOUNTER — Other Ambulatory Visit: Payer: Self-pay

## 2021-07-10 ENCOUNTER — Ambulatory Visit
Admission: RE | Admit: 2021-07-10 | Discharge: 2021-07-10 | Disposition: A | Discharge status: Home / Self Care | Payer: Medicare Other | Source: Ambulatory Visit | Attending: Radiation Oncology | Admitting: Radiation Oncology

## 2021-07-10 ENCOUNTER — Encounter: Payer: Self-pay | Admitting: Radiation Oncology

## 2021-07-10 VITALS — BP 151/77 | HR 66 | Temp 97.5°F | Resp 20 | Ht 67.0 in | Wt 258.8 lb

## 2021-07-10 DIAGNOSIS — R609 Edema, unspecified: Secondary | ICD-10-CM | POA: Insufficient documentation

## 2021-07-10 DIAGNOSIS — Z923 Personal history of irradiation: Secondary | ICD-10-CM | POA: Insufficient documentation

## 2021-07-10 DIAGNOSIS — Z79811 Long term (current) use of aromatase inhibitors: Secondary | ICD-10-CM | POA: Insufficient documentation

## 2021-07-10 DIAGNOSIS — C50412 Malignant neoplasm of upper-outer quadrant of left female breast: Secondary | ICD-10-CM | POA: Diagnosis not present

## 2021-07-10 DIAGNOSIS — R232 Flushing: Secondary | ICD-10-CM | POA: Diagnosis not present

## 2021-07-10 DIAGNOSIS — Z170 Estrogen receptor positive status [ER+]: Secondary | ICD-10-CM | POA: Diagnosis not present

## 2021-07-10 MED ORDER — RADIAPLEXRX EX GEL
Freq: Once | CUTANEOUS | Status: AC
Start: 2021-07-10 — End: 2021-07-10

## 2021-07-10 NOTE — Progress Notes (Signed)
Christy Strickland presents today for follow-up after completing radiation to her left breast on 06/09/2021  Pain: Denies pain, but does report continued sensitivity to breast and axilla Skin: Reports skin has been slow to heal, but states that it seems to have turned a corner this past week. Skin is intact with must minor areas of sensitivity/raw feeling to axilla and breast fold. Continues to use Radiaplex lotion as directed ROM: Patient denies. Reports she's diligent about doing her PT exercises daily  Lymphedema: Patient denies. Reports she has a lymphedema reassessment this coming Monday with PT MedOnc F/U: Saw Dr. Nicholas Lose on 06/24/2021, and is scheduled for Survivorship Care Plan in 09/2021 Other issues of note: Having minor side effects from letrozole (hot flashes while sleeping and intermittent ankle discomfort)  Pt reports Yes No Comments  Tamoxifen []  [x]    Letrozole [x]  []  Started 06/24/21  Anastrazole []  [x]    Mammogram []  Date:  [] 

## 2021-07-10 NOTE — Progress Notes (Signed)
Radiation Oncology         [phone] ________________________________  Name: Christy Strickland MRN: [id number]  Date: 07/10/2021  DOB: 06-22-1951  Follow-Up Visit Note  Outpatient  CC: Christy Melter, MD  Christy Lose, MD  Diagnosis and Prior Radiotherapy:    ICD-10-CM   1. Malignant neoplasm of upper-outer quadrant of left breast in female, estrogen receptor positive ([place])  C50.412 hyaluronate sodium (RADIAPLEXRX) gel   Z17.0       CHIEF COMPLAINT: Here for follow-up and surveillance of breast cancer  Narrative:  The patient returns today for routine follow-up.  Christy Strickland presents today for follow-up after completing radiation to her left breast on 06/09/2021  Pain: Denies pain, but does report continued sensitivity to breast and axilla Skin: Reports skin has been slow to heal, but states that it seems to have turned a corner this past week. Skin is intact with must minor areas of sensitivity/raw feeling to axilla and breast fold. Continues to use Radiaplex lotion as directed ROM: Patient denies. Reports she's diligent about doing her PT exercises daily  Lymphedema: Patient denies. Reports she has a lymphedema reassessment this coming Monday with PT MedOnc F/U: Saw Dr. Nicholas Strickland on 06/24/2021, and is scheduled for Survivorship Care Plan in 09/2021 Other issues of note: Having minor side effects from letrozole (hot flashes while sleeping and intermittent ankle discomfort)  Pt reports Yes No Comments  Tamoxifen []  [x]    Letrozole [x]  []  Started 06/24/21  Anastrazole []  [x]    Mammogram []  Date:  []                                   ALLERGIES:  is allergic to mushroom extract complex, amoxicillin-pot clavulanate, and meperidine hcl.  Meds: Current Outpatient Medications  Medication Sig Dispense Refill   cetirizine (ZYRTEC) 10 MG tablet Take 10 mg by mouth daily.     hydrochlorothiazide (HYDRODIURIL) 25 MG tablet Take 25 mg by mouth daily.      HYDROcodone-acetaminophen (NORCO/VICODIN) 5-325 MG tablet Take 1 tablet by mouth every 6 (six) hours as needed for moderate pain or severe pain. 10 tablet 0   letrozole (FEMARA) 2.5 MG tablet Take 1 tablet (2.5 mg total) by mouth daily. 90 tablet 3   Multiple Vitamin (MULTIVITAMIN WITH MINERALS) TABS tablet Take 1 tablet by mouth daily.     naproxen (NAPROSYN) 500 MG tablet Take 500 mg by mouth as needed.     potassium chloride (KLOR-CON) 10 MEQ tablet Take 10 mEq by mouth 2 (two) times daily.     vitamin C (ASCORBIC ACID) 250 MG tablet Take 250 mg by mouth daily.     No current facility-administered medications for this encounter.    Physical Findings: The patient is in no acute distress. Patient is alert and oriented.  height is 5\' 7"  (1.702 m) and weight is 258 lb 12.8 oz (117.4 kg). Her temperature is 97.5 F (36.4 C) (abnormal). Her blood pressure is 151/77 (abnormal) and her pulse is 66. Her respiration is 20 and oxygen saturation is 97%. .    Satisfactory skin healing in radiotherapy fields.  There is still some residual hyperpigmentation and dryness over the left breast.  The skin is now intact.  She has a mole that is about 3 mm in dimension in the upper right chest, just lateral to midline.  This is dark and crusted, benign-appearing   Lab Findings: Lab Results  Component Value Date   WBC 7.1 03/25/2021   HGB 14.6 03/25/2021   HCT 42.0 03/25/2021   MCV 88.6 03/25/2021   PLT 341 03/25/2021    Radiographic Findings: No results found.  Impression/Plan: Healing well from radiotherapy to the breast tissue.  Continue skin care with radiaPlex for 2 more weeks and then topical Vitamin E Oil and / or lotion for at least 2 more months for further healing.  Moisturize skin twice a day  I encouraged her to continue with yearly mammography as appropriate (for intact breast tissue) and followup with medical oncology. I will see her back on an as-needed basis. I have encouraged her to  call if she has any issues or concerns in the future. I wished her the very best.  She asked about the mole noted in the physical exam above.  This appears benign but she knows to seek a dermatology referral if this shows substantial growth (if it grows to the size of a pencil eraser).   On date of service, in total, I spent 20 minutes on this encounter. Patient was seen in person.  _____________________________________   Eppie Gibson, MD

## 2021-07-13 ENCOUNTER — Encounter: Payer: Self-pay | Admitting: Rehabilitation

## 2021-07-13 ENCOUNTER — Other Ambulatory Visit: Payer: Self-pay

## 2021-07-13 ENCOUNTER — Ambulatory Visit: Payer: Medicare Other | Attending: General Surgery | Admitting: Rehabilitation

## 2021-07-13 DIAGNOSIS — R293 Abnormal posture: Secondary | ICD-10-CM | POA: Insufficient documentation

## 2021-07-13 DIAGNOSIS — Z170 Estrogen receptor positive status [ER+]: Secondary | ICD-10-CM | POA: Insufficient documentation

## 2021-07-13 DIAGNOSIS — C50412 Malignant neoplasm of upper-outer quadrant of left female breast: Secondary | ICD-10-CM | POA: Insufficient documentation

## 2021-07-13 NOTE — Therapy (Signed)
[facility] @ [address] [place], [place], [address] Phone: [phone]   Fax:  [phone]  Physical Therapy Treatment  Patient Details  Name: Christy Strickland MRN: [id number] Date of Birth: 07/01/1951 Referring Provider (PT): Dr. Autumn Messing   Encounter Date: 07/13/2021   PT End of Session - 07/13/21 0956     Visit Number 2    Number of Visits 2    Date for PT Re-Evaluation 05/20/21    PT Start Time 0945    PT Stop Time 0950    PT Time Calculation (min) 5 min    Activity Tolerance Patient tolerated treatment well    Behavior During Therapy [facility] for tasks assessed/performed             Past Medical History:  Diagnosis Date   Arthritis    Breast cancer ([place])    left breast IDC   Hypertension    Neuromuscular disorder ([place])    neuropathy in feet   Seasonal allergies    Sleep apnea    uses CPAP nightly    Past Surgical History:  Procedure Laterality Date   BACK SURGERY     lower back   bilarteral knee replacements Bilateral    BREAST LUMPECTOMY WITH RADIOACTIVE SEED AND SENTINEL LYMPH NODE BIOPSY Left 04/02/2021   Procedure: LEFT BREAST LUMPECTOMY WITH RADIOACTIVE SEED AND SENTINEL LYMPH NODE BIOPSY;  Surgeon: Jovita Kussmaul, MD;  Location: [facility];  Service: General;  Laterality: Left;  120 MINUTES ROOM 2 IQ   CESAREAN SECTION     x 2   COLON SURGERY     DIAGNOSTIC LAPAROSCOPY     benign mass   DILATATION & CURETTAGE/HYSTEROSCOPY WITH MYOSURE  04/27/2018   Procedure: DILATATION & CURETTAGE/HYSTEROSCOPY WITH MYOSURE;  Surgeon: Janyth Pupa, DO;  Location: [facility];  Service: Gynecology;;   DILATION AND CURETTAGE OF UTERUS     x 2    RADIOACTIVE SEED GUIDED AXILLARY SENTINEL LYMPH NODE Left 04/02/2021   Procedure: RADIOACTIVE SEED GUIDED AXILLARY SENTINEL LYMPH NODE DISSECTION;  Surgeon: Jovita Kussmaul, MD;  Location: [facility];  Service: General;  Laterality: Left;    remove pituitary gland      There were no vitals filed for this visit.   Subjective Assessment - 07/13/21 0952     Subjective No signs of lymphedema    Currently in Pain? No/denies                    L-DEX FLOWSHEETS - 07/13/21 0900       L-DEX LYMPHEDEMA SCREENING   Measurement Type Unilateral    L-DEX MEASUREMENT EXTREMITY Upper Extremity    POSITION  Standing    DOMINANT SIDE Right    At Risk Side Left    BASELINE SCORE (UNILATERAL) -0.8                                     PT Long Term Goals - 05/19/21 1346       PT LONG TERM GOAL #1   Title Patient will demonstrate she has regained full shoulder ROM and function post operatively compared to baselines.    Status Achieved                   Plan - 07/13/21 0957     Clinical Impression Statement No change from  baseline.  Will be seen for 6 months [facility] recheck    PT Next Visit Plan [facility] only             Patient will benefit from skilled therapeutic intervention in order to improve the following deficits and impairments:     Visit Diagnosis: Malignant neoplasm of upper-outer quadrant of left breast in female, estrogen receptor positive ([place])  Abnormal posture     Problem List Patient Active Problem List   Diagnosis Date Noted   Malignant neoplasm of upper-outer quadrant of left breast in female, estrogen receptor positive ([place]) 03/23/2021    Stark Bray, PT 07/13/2021, 9:59 AM  [facility] @ [address] [place], [place], [address] Phone: [phone]   Fax:  [phone]  Name: Christy Strickland MRN: [id number] Date of Birth: 1950-09-16

## 2021-07-20 ENCOUNTER — Other Ambulatory Visit: Payer: Self-pay | Admitting: Hematology and Oncology

## 2021-07-20 DIAGNOSIS — E2839 Other primary ovarian failure: Secondary | ICD-10-CM

## 2021-07-22 NOTE — Progress Notes (Signed)
° °                                                                                                                                                          °  Patient Name: Christy Strickland MRN: [id number] DOB: 1950-12-15 Referring Physician: Nicholas Lose (Profile Not Attached) Date of Service: 06/09/2021 [facility], [place]                                                        End Of Treatment Note  Diagnoses: C50.412-Malignant neoplasm of upper-outer quadrant of left female breast  Cancer Staging:  Cancer Staging  Malignant neoplasm of upper-outer quadrant of left breast in female, estrogen receptor positive ([place]) Staging form: Breast, AJCC 8th Edition - Clinical stage from 03/25/2021: Stage IB (cT1c, cN1, cM0, G2, ER+, PR+, HER2-) - Signed by Nicholas Lose, MD on 03/25/2021 Stage prefix: Initial diagnosis Histologic grading system: 3 grade system  Intent: Curative  Radiation Treatment Dates: 05/06/2021 through 06/09/2021 Site Technique Total Dose (Gy) Dose per Fx (Gy) Completed Fx Beam Energies  Breast, Left: Breast_Lt 3D 50/50 2 25/25 10X, 15X  Breast, Left: Breast_Lt_SCV_PAB 3D 50/50 2 25/25 6X, 15X   Narrative: The patient tolerated radiation therapy relatively well.   Plan: The patient will follow-up with radiation oncology in 68mo.  ____________________________  SEppie Gibson MD

## 2021-07-23 ENCOUNTER — Ambulatory Visit
Admission: RE | Admit: 2021-07-23 | Discharge: 2021-07-23 | Disposition: A | Discharge status: Home / Self Care | Payer: Medicare Other | Source: Ambulatory Visit | Attending: Hematology and Oncology | Admitting: Hematology and Oncology

## 2021-07-23 DIAGNOSIS — E2839 Other primary ovarian failure: Secondary | ICD-10-CM

## 2021-07-23 DIAGNOSIS — Z780 Asymptomatic menopausal state: Secondary | ICD-10-CM | POA: Diagnosis not present

## 2021-09-02 ENCOUNTER — Encounter: Payer: Self-pay | Admitting: Hematology and Oncology

## 2021-09-17 DIAGNOSIS — U071 COVID-19: Secondary | ICD-10-CM | POA: Diagnosis not present

## 2021-09-24 ENCOUNTER — Telehealth: Payer: Self-pay | Admitting: *Deleted

## 2021-09-25 ENCOUNTER — Encounter: Payer: Self-pay | Admitting: Adult Health

## 2021-09-25 ENCOUNTER — Other Ambulatory Visit: Payer: Self-pay

## 2021-09-25 ENCOUNTER — Inpatient Hospital Stay: Payer: Medicare Other | Attending: Adult Health | Admitting: Adult Health

## 2021-09-25 VITALS — BP 177/78 | HR 75 | Temp 97.5°F | Resp 18 | Ht 67.0 in | Wt 260.4 lb

## 2021-09-25 DIAGNOSIS — Z923 Personal history of irradiation: Secondary | ICD-10-CM | POA: Insufficient documentation

## 2021-09-25 DIAGNOSIS — Z880 Allergy status to penicillin: Secondary | ICD-10-CM | POA: Diagnosis not present

## 2021-09-25 DIAGNOSIS — Z79899 Other long term (current) drug therapy: Secondary | ICD-10-CM | POA: Diagnosis not present

## 2021-09-25 DIAGNOSIS — C50412 Malignant neoplasm of upper-outer quadrant of left female breast: Secondary | ICD-10-CM | POA: Insufficient documentation

## 2021-09-25 DIAGNOSIS — Z79811 Long term (current) use of aromatase inhibitors: Secondary | ICD-10-CM | POA: Diagnosis not present

## 2021-09-25 DIAGNOSIS — Z170 Estrogen receptor positive status [ER+]: Secondary | ICD-10-CM | POA: Diagnosis not present

## 2021-09-25 DIAGNOSIS — C773 Secondary and unspecified malignant neoplasm of axilla and upper limb lymph nodes: Secondary | ICD-10-CM | POA: Insufficient documentation

## 2021-09-25 DIAGNOSIS — R5383 Other fatigue: Secondary | ICD-10-CM | POA: Diagnosis not present

## 2021-09-25 NOTE — Progress Notes (Signed)
SURVIVORSHIP  VISIT:    BRIEF ONCOLOGIC HISTORY:  Oncology History  Malignant neoplasm of upper-outer quadrant of left breast in female, estrogen receptor positive ([place])  03/23/2021 Initial Diagnosis   Screening mammogram: a possible mass and abnormal lymph node in the left breast. Diagnostic mammogram and [place]: suspicious 1.1 cm mass at the left breast 2 o'clock position and enlarged left axillary lymph node. 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  ER 70%, PR 40%, Ki-67 15%, HER2 negative by [facility]   03/25/2021 Cancer Staging   Staging form: Breast, AJCC 8th Edition - Clinical stage from 03/25/2021: Stage IB (cT1c, cN1, cM0, G2, ER+, PR+, HER2-) - Signed by Nicholas Lose, MD on 03/25/2021 Stage prefix: Initial diagnosis Histologic grading system: 3 grade system    04/02/2021 Surgery   Left lumpectomy: Grade 2 IDC, 1.1 cm, with intermediate grade DCIS, margins negative, 1/4 lymph nodes positive with extracapsular extension, ER 70%, PR 40%, HER2 equal vocal negative FISH, Ki-67 15%   04/02/2021 Miscellaneous   Mammaprint: Low risk   05/07/2021 - 06/09/2021 Radiation Therapy   Adjuvant radiation   06/24/2021 -  Anti-estrogen oral therapy   Adjuvant antiestrogen therapy with letrozole 2.5 mg daily     INTERVAL HISTORY:  Christy Strickland to review her survivorship care plan detailing her treatment course for breast cancer, as well as monitoring long-term side effects of that treatment, education regarding health maintenance, screening, and overall wellness and health promotion.     Overall, Christy Strickland reports feeling quite well.  She still struggles with fatigue since treatment, but otherwise is doing well.    REVIEW OF SYSTEMS:  Review of Systems  Constitutional:  Negative for appetite change, chills, fatigue, fever and unexpected weight change.  HENT:   Negative for hearing loss, lump/mass and trouble swallowing.   Eyes:  Negative for  eye problems and icterus.  Respiratory:  Negative for chest tightness, cough and shortness of breath.   Cardiovascular:  Negative for chest pain, leg swelling and palpitations.  Gastrointestinal:  Negative for abdominal distention, abdominal pain, constipation, diarrhea, nausea and vomiting.  Endocrine: Negative for hot flashes.  Genitourinary:  Negative for difficulty urinating.   Musculoskeletal:  Negative for arthralgias.  Skin:  Negative for itching and rash.  Neurological:  Negative for dizziness, extremity weakness, headaches and numbness.  Hematological:  Negative for adenopathy. Does not bruise/bleed easily.  Psychiatric/Behavioral:  Negative for depression. The patient is not nervous/anxious.   Breast: Denies any new nodularity, masses, tenderness, nipple changes, or nipple discharge.      ONCOLOGY TREATMENT TEAM:  1. Surgeon:  Dr. Marlou Starks at [facility] 2. Medical Oncologist: Dr. Lindi Adie  3. Radiation Oncologist: Dr. Isidore Moos    PAST MEDICAL/SURGICAL HISTORY:  Past Medical History:  Diagnosis Date   Arthritis    Breast cancer ([place])    left breast IDC   Hypertension    Neuromuscular disorder ([place])    neuropathy in feet   Seasonal allergies    Sleep apnea    uses CPAP nightly   Past Surgical History:  Procedure Laterality Date   BACK SURGERY     lower back   bilarteral knee replacements Bilateral    BREAST LUMPECTOMY WITH RADIOACTIVE SEED AND SENTINEL LYMPH NODE BIOPSY Left 04/02/2021   Procedure: LEFT BREAST LUMPECTOMY WITH RADIOACTIVE SEED AND SENTINEL LYMPH NODE BIOPSY;  Surgeon: Jovita Kussmaul, MD;  Location: [facility];  Service: General;  Laterality: Left;  120  MINUTES ROOM 2 IQ   CESAREAN SECTION     x 2   COLON SURGERY     DIAGNOSTIC LAPAROSCOPY     benign mass   DILATATION & CURETTAGE/HYSTEROSCOPY WITH MYOSURE  04/27/2018   Procedure: DILATATION & CURETTAGE/HYSTEROSCOPY WITH MYOSURE;  Surgeon: Janyth Pupa, DO;  Location: [facility];   Service: Gynecology;;   DILATION AND CURETTAGE OF UTERUS     x 2    RADIOACTIVE SEED GUIDED AXILLARY SENTINEL LYMPH NODE Left 04/02/2021   Procedure: RADIOACTIVE SEED GUIDED AXILLARY SENTINEL LYMPH NODE DISSECTION;  Surgeon: Jovita Kussmaul, MD;  Location: [facility];  Service: General;  Laterality: Left;   remove pituitary gland       ALLERGIES:  Allergies  Allergen Reactions   Mushroom Extract Complex Other (See Comments)    Abdominal pain.   Amoxicillin-Pot Clavulanate Itching and Other (See Comments)    Has patient had a PCN reaction causing immediate rash, facial/tongue/throat swelling, SOB or lightheadedness with hypotension: No Has patient had a PCN reaction causing severe rash involving mucus membranes or skin necrosis: No Has patient had a PCN reaction that required hospitalization: No Has patient had a PCN reaction occurring within the last 10 years: No If all of the above answers are "NO", then may proceed with Cephalosporin use.    Meperidine Hcl Anxiety    agitation     CURRENT MEDICATIONS:  Outpatient Encounter Medications as of 09/25/2021  Medication Sig   cetirizine (ZYRTEC) 10 MG tablet Take 10 mg by mouth daily.   hydrochlorothiazide (HYDRODIURIL) 25 MG tablet Take 25 mg by mouth daily.   letrozole (FEMARA) 2.5 MG tablet Take 1 tablet (2.5 mg total) by mouth daily.   Multiple Vitamin (MULTIVITAMIN WITH MINERALS) TABS tablet Take 1 tablet by mouth daily.   vitamin C (ASCORBIC ACID) 250 MG tablet Take 250 mg by mouth daily.   naproxen (NAPROSYN) 500 MG tablet Take 500 mg by mouth as needed. (Patient not taking: Reported on 09/25/2021)   potassium chloride (KLOR-CON) 10 MEQ tablet Take 10 mEq by mouth 2 (two) times daily. (Patient not taking: Reported on 09/25/2021)   [DISCONTINUED] HYDROcodone-acetaminophen (NORCO/VICODIN) 5-325 MG tablet Take 1 tablet by mouth every 6 (six) hours as needed for moderate pain or severe pain. (Patient not taking: Reported  on 09/25/2021)   No facility-administered encounter medications on file as of 09/25/2021.     ONCOLOGIC FAMILY HISTORY:  Non Contributory   GENETIC COUNSELING/TESTING: See above if indicated  SOCIAL HISTORY:  Social History   Socioeconomic History   Marital status: Married    Spouse name: Jeneen Rinks   Number of children: 2   Years of education: Not on file   Highest education level: Not on file  Occupational History   Not on file  Tobacco Use   Smoking status: Never   Smokeless tobacco: Never  Vaping Use   Vaping Use: Never used  Substance and Sexual Activity   Alcohol use: Yes    Comment: occasional   Drug use: Never   Sexual activity: Not Currently    Birth control/protection: Post-menopausal  Other Topics Concern   Not on file  Social History Narrative   Not on file   Social Determinants of Health   Financial Resource Strain: Not on file  Food Insecurity: Not on file  Transportation Needs: Not on file  Physical Activity: Not on file  Stress: Not on file  Social Connections: Not on file  Intimate Partner Violence: Not on  file     OBSERVATIONS/OBJECTIVE:  BP (!) 177/78 (BP Location: Left Arm, Patient Position: Sitting)    Pulse 75    Temp (!) 97.5 F (36.4 C) (Temporal)    Resp 18    Ht 5' 7"  (1.702 m)    Wt 260 lb 6.4 oz (118.1 kg)    LMP  (LMP Unknown)    SpO2 98%    BMI 40.78 kg/m  GENERAL: Patient is a well appearing female in no acute distress HEENT:  Sclerae anicteric.  Oropharynx clear and moist. No ulcerations or evidence of oropharyngeal candidiasis. Neck is supple.  NODES:  No cervical, supraclavicular, or axillary lymphadenopathy palpated.  BREAST EXAM:  left breast s/p lumpectomy and radiation, no sign of local recurrence.   Right breast benign. LUNGS:  Clear to auscultation bilaterally.  No wheezes or rhonchi. HEART:  Regular rate and rhythm. No murmur appreciated. ABDOMEN:  Soft, nontender.  Positive, normoactive bowel sounds. No organomegaly  palpated. MSK:  No focal spinal tenderness to palpation. Full range of motion bilaterally in the upper extremities. EXTREMITIES:  No peripheral edema.   SKIN:  Clear with no obvious rashes or skin changes. No nail dyscrasia. NEURO:  Nonfocal. Well oriented.  Appropriate affect.   LABORATORY DATA:  None for this visit.  DIAGNOSTIC IMAGING:  None for this visit.      ASSESSMENT AND PLAN:  Ms.. Strickland is a pleasant 71 y.o. female with Stage IB left breast invasive ductal carcinoma, ER+/PR+/HER2-, diagnosed in 03/2021, treated with lumpectomy, adjuvant radiation therapy, and anti-estrogen therapy with Letrozole beginning in 06/2021.  She presents to the Survivorship Clinic for our initial meeting and routine follow-up post-completion of treatment for breast cancer.    1. Stage IB left breast cancer:  Christy Strickland is continuing to recover from definitive treatment for breast cancer. She will follow-up with her medical oncologist, Dr. Lindi Adie in 04/2022 with history and physical exam per surveillance protocol.  She will continue her anti-estrogen therapy with Letrozole. Thus far, she is tolerating the Letrozole well, with minimal side effects. She was instructed to make Dr. Lindi Adie or myself aware if she begins to experience any worsening side effects of the medication and I could see her back in clinic to help manage those side effects, as needed. Her mammogram is due 02/2022; orders placed today.. Today, a comprehensive survivorship care plan and treatment summary was reviewed with the patient today detailing her breast cancer diagnosis, treatment course, potential late/long-term effects of treatment, appropriate follow-up care with recommendations for the future, and patient education resources.  A copy of this summary, along with a letter will be sent to the patients primary care provider via mail/fax/In Basket message after todays visit.    2. Bone health:  Given Christy Strickland's age/history of breast  cancer and her current treatment regimen including anti-estrogen therapy with Letrozole, she is at risk for bone demineralization.  Her last DEXA scan was 07/2021 and was normal.  We recommend that she repeat bone density testing in 2 years.  She was given education on specific activities to promote bone health.  3. Cancer screening:  Due to Christy Strickland's history and her age, she should receive screening for skin cancers, colon cancer.  The information and recommendations are listed on the patient's comprehensive care plan/treatment summary and were reviewed in detail with the patient.    4. Health maintenance and wellness promotion: Christy Strickland was encouraged to consume 5-7 servings of fruits and vegetables per day. We reviewed  the "Nutrition Rainbow" handout.  She was also encouraged to engage in moderate to vigorous exercise for 30 minutes per day most days of the week. We discussed the [employer] fitness program, which is designed for cancer survivors to help them become more physically fit after cancer treatments.  She was instructed to limit her alcohol consumption and continue to abstain from tobacco use.     5. Support services/counseling: It is not uncommon for this period of the patient's cancer care trajectory to be one of many emotions and stressors.  She was given information regarding our available services and encouraged to contact me with any questions or for help enrolling in any of our support group/programs.    Follow up instructions:    -Return to cancer center in 04/2022 for f/u with Dr. Verdie Drown  -Mammogram due in 02/2022 -Follow up with surgery 10/2021 -She is welcome to return back to the Survivorship Clinic at any time; no additional follow-up needed at this time.  -Consider referral back to survivorship as a long-term survivor for continued surveillance  The patient was provided an opportunity to ask questions and all were answered. The patient agreed with the plan and  demonstrated an understanding of the instructions.    Total encounter time: 45 minutes in face to face visit time, chart review, lab review, order entry,c are coordination, and documentation of the encounter.   Wilber Bihari, NP 09/25/21 12:33 PM Medical Oncology and Hematology [facility] [place], [place] [address] Tel. [phone]    Fax. [phone]  *Total Encounter Time as defined by the Centers for Medicare and Medicaid Services includes, in addition to the face-to-face time of a patient visit (documented in the note above) non-face-to-face time: obtaining and reviewing outside history, ordering and reviewing medications, tests or procedures, care coordination (communications with other health care professionals or caregivers) and documentation in the medical record.

## 2021-09-28 ENCOUNTER — Telehealth: Payer: Self-pay | Admitting: Adult Health

## 2021-09-28 NOTE — Telephone Encounter (Signed)
Scheduled appointment per 2/17 los. Patient is aware. Patient will be mailed an updated calendar.

## 2021-10-13 ENCOUNTER — Encounter: Payer: Self-pay | Admitting: Adult Health

## 2021-10-19 ENCOUNTER — Other Ambulatory Visit: Payer: Self-pay

## 2021-10-19 ENCOUNTER — Ambulatory Visit: Payer: Medicare Other | Attending: General Surgery

## 2021-10-19 VITALS — Wt 265.0 lb

## 2021-10-19 DIAGNOSIS — Z483 Aftercare following surgery for neoplasm: Secondary | ICD-10-CM | POA: Insufficient documentation

## 2021-10-19 NOTE — Therapy (Signed)
[facility] ?[facility] [place] ?[place], [place], [address] ?Phone: [phone]   Fax:  [phone] ? ?Physical Therapy Treatment ? ?Patient Details  ?Name: Christy Strickland ?MRN: [id number] ?Date of Birth: June 01, 1951 ?Referring Provider (PT): Dr. Autumn Messing ? ? ?Encounter Date: 10/19/2021 ? ? PT End of Session - 10/19/21 1038   ? ? Visit Number 2   # unchanged due to screen only  ? PT Start Time 1035   ? PT Stop Time 9702   ? PT Time Calculation (min) 5 min   ? Activity Tolerance Patient tolerated treatment well   ? Behavior During Therapy [facility] for tasks assessed/performed   ? ?  ?  ? ?  ? ? ?Past Medical History:  ?Diagnosis Date  ? Arthritis   ? Breast cancer ([place])   ? left breast IDC  ? Hypertension   ? Neuromuscular disorder ([place])   ? neuropathy in feet  ? Seasonal allergies   ? Sleep apnea   ? uses CPAP nightly  ? ? ?Past Surgical History:  ?Procedure Laterality Date  ? BACK SURGERY    ? lower back  ? bilarteral knee replacements Bilateral   ? BREAST LUMPECTOMY WITH RADIOACTIVE SEED AND SENTINEL LYMPH NODE BIOPSY Left 04/02/2021  ? Procedure: LEFT BREAST LUMPECTOMY WITH RADIOACTIVE SEED AND SENTINEL LYMPH NODE BIOPSY;  Surgeon: Jovita Kussmaul, MD;  Location: [facility];  Service: General;  Laterality: Left;  120 MINUTES ROOM 2 IQ  ? CESAREAN SECTION    ? x 2  ? COLON SURGERY    ? DIAGNOSTIC LAPAROSCOPY    ? benign mass  ? DILATATION & CURETTAGE/HYSTEROSCOPY WITH MYOSURE  04/27/2018  ? Procedure: DILATATION & CURETTAGE/HYSTEROSCOPY WITH MYOSURE;  Surgeon: Janyth Pupa, DO;  Location: [facility];  Service: Gynecology;;  ? DILATION AND CURETTAGE OF UTERUS    ? x 2   ? RADIOACTIVE SEED GUIDED AXILLARY SENTINEL LYMPH NODE Left 04/02/2021  ? Procedure: RADIOACTIVE SEED GUIDED AXILLARY SENTINEL LYMPH NODE DISSECTION;  Surgeon: Jovita Kussmaul, MD;  Location: [facility];  Service: General;  Laterality: Left;  ? remove pituitary gland     ? ? ?Vitals:  ? 10/19/21 1037  ?Weight: 265 lb (120.2 kg)  ? ? ? Subjective Assessment - 10/19/21 1037   ? ? Subjective Pt returns for her 3 month L-Dex screen.   ? Pertinent History Patient was diagnosed on 03/04/2021 with left grade II invasive ductal carcinoma breast cancer. It measures 1.1 cm and is located in the upper outer quadrant. It is ER/PR positive and HER2 negative with a Ki67 of 15%. She has baseline bilateral feet neuropathy. She has a positive axillary lymph node. Lt lumpectomy with 1/4 lymph nodes positive on 04/02/21. Currently in radiation.   ? ?  ?  ? ?  ? ? ? ? ? ? ? ? ? L-DEX FLOWSHEETS - 10/19/21 1000   ? ?  ? L-DEX LYMPHEDEMA SCREENING  ? Measurement Type Unilateral   ? L-DEX MEASUREMENT EXTREMITY Upper Extremity   ? POSITION  Standing   ? DOMINANT SIDE Right   ? At Risk Side Left   ? BASELINE SCORE (UNILATERAL) -0.8   ? L-DEX SCORE (UNILATERAL) 3.7   ? VALUE CHANGE (UNILAT) 4.5   ? ?  ?  ? ?  ? ? ? ? ? ? ? ? ? ? ? ? ? ? ? ? ? ? ? ? ? ? ? ? ? ?  PT Long Term Goals - 05/19/21 1346   ? ?  ? PT LONG TERM GOAL #1  ? Title Patient will demonstrate she has regained full shoulder ROM and function post operatively compared to baselines.   ? Status Achieved   ? ?  ?  ? ?  ? ? ? ? ? ? ? ? Plan - 10/19/21 1039   ? ? Clinical Impression Statement Pt returns for her 3 month L-Dex screen. Hre change from baseline of 4.5 is WNLs so no further treament is required at this time except to cont every 3 month L-Dex screens which pt is agreeable to.   ? PT Next Visit Plan Cont every 3 month L-Dex screens for up to 2 years from her SLNB (~04/03/2023)   ? Consulted and Agree with Plan of Care Patient;Family member/caregiver   ? Family Member Consulted husband   ? ?  ?  ? ?  ? ? ?Patient will benefit from skilled therapeutic intervention in order to improve the following deficits and impairments:    ? ?Visit Diagnosis: ?Aftercare following surgery for neoplasm ? ? ? ? ?Problem List ?Patient Active Problem List  ?  Diagnosis Date Noted  ? Malignant neoplasm of upper-outer quadrant of left breast in female, estrogen receptor positive ([place]) 03/23/2021  ? ? ?Otelia Limes, PTA ?10/19/2021, 10:42 AM ? ?[facility] ?[facility] [place] ?[place], [place], [address] ?Phone: [phone]   Fax:  [phone] ? ?Name: Christy Strickland ?MRN: [id number] ?Date of Birth: 06/25/1951 ? ? ? ?

## 2021-10-20 DIAGNOSIS — C50412 Malignant neoplasm of upper-outer quadrant of left female breast: Secondary | ICD-10-CM | POA: Diagnosis not present

## 2021-10-20 DIAGNOSIS — Z170 Estrogen receptor positive status [ER+]: Secondary | ICD-10-CM | POA: Diagnosis not present

## 2021-10-30 ENCOUNTER — Telehealth: Payer: Self-pay | Admitting: Hematology and Oncology

## 2021-10-30 NOTE — Telephone Encounter (Signed)
.  Called patient to schedule appointment per 3/23 inbasket, patient is aware of date and time.   ?

## 2021-11-02 ENCOUNTER — Encounter: Payer: Self-pay | Admitting: Adult Health

## 2021-11-02 ENCOUNTER — Telehealth: Payer: Self-pay

## 2021-11-02 NOTE — Telephone Encounter (Signed)
Called pt regarding mychart message after reviewing with LC. Pt was offered to: ?Stop Letrozole for 1-2 weeks then restart. ?Start effexor 37.5 mg X2 weeks then increase on an as needed basis.  ?Pt was indecisive about which she prefers, but overall made the decision to stop for 2 weeks then come in for a visit with Mendel Ryder to discuss restarting and possibly taking effexor. Message sent to scheduling to have the pt come in for visit with LC week of 4/10.  ?

## 2021-11-03 ENCOUNTER — Telehealth: Payer: Self-pay

## 2021-11-03 NOTE — Telephone Encounter (Signed)
Spoke with patient regarding timing of next f/u with NP. First available apt is 11/30/21. Patient will continue taking Letrozole for two weeks and then stop with office visit to evaluate hot flashes on 11/30/21. Patient voiced comfort with this decision. ?

## 2021-11-10 DIAGNOSIS — E7800 Pure hypercholesterolemia, unspecified: Secondary | ICD-10-CM | POA: Diagnosis not present

## 2021-11-10 DIAGNOSIS — Z0000 Encounter for general adult medical examination without abnormal findings: Secondary | ICD-10-CM | POA: Diagnosis not present

## 2021-11-10 DIAGNOSIS — I10 Essential (primary) hypertension: Secondary | ICD-10-CM | POA: Diagnosis not present

## 2021-11-10 DIAGNOSIS — Z1211 Encounter for screening for malignant neoplasm of colon: Secondary | ICD-10-CM | POA: Diagnosis not present

## 2021-11-10 DIAGNOSIS — C50412 Malignant neoplasm of upper-outer quadrant of left female breast: Secondary | ICD-10-CM | POA: Diagnosis not present

## 2021-11-25 ENCOUNTER — Encounter (HOSPITAL_COMMUNITY): Payer: Self-pay

## 2021-11-26 ENCOUNTER — Other Ambulatory Visit: Payer: Self-pay | Admitting: *Deleted

## 2021-11-26 DIAGNOSIS — Z170 Estrogen receptor positive status [ER+]: Secondary | ICD-10-CM

## 2021-11-30 ENCOUNTER — Inpatient Hospital Stay (HOSPITAL_BASED_OUTPATIENT_CLINIC_OR_DEPARTMENT_OTHER): Payer: Medicare Other | Admitting: Adult Health

## 2021-11-30 ENCOUNTER — Other Ambulatory Visit: Payer: Self-pay

## 2021-11-30 ENCOUNTER — Encounter: Payer: Self-pay | Admitting: Adult Health

## 2021-11-30 ENCOUNTER — Inpatient Hospital Stay: Payer: Medicare Other | Attending: Adult Health

## 2021-11-30 VITALS — BP 179/88 | HR 67 | Temp 97.9°F | Resp 18 | Ht 67.0 in | Wt 260.8 lb

## 2021-11-30 DIAGNOSIS — M2550 Pain in unspecified joint: Secondary | ICD-10-CM | POA: Insufficient documentation

## 2021-11-30 DIAGNOSIS — Z79811 Long term (current) use of aromatase inhibitors: Secondary | ICD-10-CM | POA: Diagnosis not present

## 2021-11-30 DIAGNOSIS — Z170 Estrogen receptor positive status [ER+]: Secondary | ICD-10-CM

## 2021-11-30 DIAGNOSIS — R232 Flushing: Secondary | ICD-10-CM | POA: Diagnosis not present

## 2021-11-30 DIAGNOSIS — C50412 Malignant neoplasm of upper-outer quadrant of left female breast: Secondary | ICD-10-CM

## 2021-11-30 DIAGNOSIS — C773 Secondary and unspecified malignant neoplasm of axilla and upper limb lymph nodes: Secondary | ICD-10-CM | POA: Insufficient documentation

## 2021-11-30 DIAGNOSIS — Z79899 Other long term (current) drug therapy: Secondary | ICD-10-CM | POA: Insufficient documentation

## 2021-11-30 LAB — CBC WITH DIFFERENTIAL (CANCER CENTER ONLY)
Abs Immature Granulocytes: 0.02 10*3/uL (ref 0.00–0.07)
Basophils Absolute: 0 10*3/uL (ref 0.0–0.1)
Basophils Relative: 1 %
Eosinophils Absolute: 0.1 10*3/uL (ref 0.0–0.5)
Eosinophils Relative: 3 %
HCT: 42.4 % (ref 36.0–46.0)
Hemoglobin: 14.7 g/dL (ref 12.0–15.0)
Immature Granulocytes: 0 %
Lymphocytes Relative: 23 %
Lymphs Abs: 1.3 10*3/uL (ref 0.7–4.0)
MCH: 31.2 pg (ref 26.0–34.0)
MCHC: 34.7 g/dL (ref 30.0–36.0)
MCV: 90 fL (ref 80.0–100.0)
Monocytes Absolute: 0.5 10*3/uL (ref 0.1–1.0)
Monocytes Relative: 9 %
Neutro Abs: 3.7 10*3/uL (ref 1.7–7.7)
Neutrophils Relative %: 64 %
Platelet Count: 345 10*3/uL (ref 150–400)
RBC: 4.71 MIL/uL (ref 3.87–5.11)
RDW: 12.1 % (ref 11.5–15.5)
WBC Count: 5.7 10*3/uL (ref 4.0–10.5)
nRBC: 0 % (ref 0.0–0.2)

## 2021-11-30 LAB — CMP (CANCER CENTER ONLY)
ALT: 20 U/L (ref 0–44)
AST: 22 U/L (ref 15–41)
Albumin: 4.1 g/dL (ref 3.5–5.0)
Alkaline Phosphatase: 64 U/L (ref 38–126)
Anion gap: 7 (ref 5–15)
BUN: 18 mg/dL (ref 8–23)
CO2: 30 mmol/L (ref 22–32)
Calcium: 11 mg/dL — ABNORMAL HIGH (ref 8.9–10.3)
Chloride: 102 mmol/L (ref 98–111)
Creatinine: 1.15 mg/dL — ABNORMAL HIGH (ref 0.44–1.00)
GFR, Estimated: 51 mL/min — ABNORMAL LOW (ref >60)
Glucose, Bld: 89 mg/dL (ref 70–99)
Potassium: 3.7 mmol/L (ref 3.5–5.1)
Sodium: 139 mmol/L (ref 135–145)
Total Bilirubin: 0.4 mg/dL (ref 0.3–1.2)
Total Protein: 8 g/dL (ref 6.5–8.1)

## 2021-11-30 MED ORDER — ANASTROZOLE 1 MG PO TABS: 1.0000 mg | ORAL_TABLET | Freq: Every day | ORAL | 3 refills | Status: DC

## 2021-11-30 NOTE — Progress Notes (Signed)
[facility] Follow up: ?  ? ?Christy Melter, MD ?[address] ?[place] [address] ? ? ?DIAGNOSIS:  Cancer Staging  ?Malignant neoplasm of upper-outer quadrant of left breast in female, estrogen receptor positive ([place]) ?Staging form: Breast, AJCC 8th Edition ?- Clinical stage from 03/25/2021: Stage IB (cT1c, cN1, cM0, G2, ER+, PR+, HER2-) - Signed by Nicholas Lose, MD on 03/25/2021 ?Stage prefix: Initial diagnosis ?Histologic grading system: 3 grade system ? ? ?SUMMARY OF ONCOLOGIC HISTORY: ?Oncology History  ?Malignant neoplasm of upper-outer quadrant of left breast in female, estrogen receptor positive ([place])  ?03/23/2021 Initial Diagnosis  ? Screening mammogram: a possible mass and abnormal lymph node in the left breast. Diagnostic mammogram and [place]: suspicious 1.1 cm mass at the left breast 2 o'clock position and enlarged left axillary lymph node. 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  ER 70%, PR 40%, Ki-67 15%, HER2 negative by FISH ?  ?03/25/2021 Cancer Staging  ? Staging form: Breast, AJCC 8th Edition ?- Clinical stage from 03/25/2021: Stage IB (cT1c, cN1, cM0, G2, ER+, PR+, HER2-) - Signed by Nicholas Lose, MD on 03/25/2021 ?Stage prefix: Initial diagnosis ?Histologic grading system: 3 grade system ? ?  ?04/02/2021 Surgery  ? Left lumpectomy: Grade 2 IDC, 1.1 cm, with intermediate grade DCIS, margins negative, 1/4 lymph nodes positive with extracapsular extension, ER 70%, PR 40%, HER2 equal vocal negative FISH, Ki-67 15% ?  ?04/02/2021 Miscellaneous  ? Mammaprint: Low risk ?  ?05/07/2021 - 06/09/2021 Radiation Therapy  ? Adjuvant radiation ?  ?06/24/2021 -  Anti-estrogen oral therapy  ? Adjuvant antiestrogen therapy with letrozole 2.5 mg daily ?  ? ? ?CURRENT THERAPY: Letrozole ? ?INTERVAL HISTORY: ?Christy Strickland 71 y.o. female returns for follow up of the issues she has been experiencing since stopping letrozole.  She  noted her hot flashes are mildly better, however still present.  She also still has some arthralgias, but these are improving.  She is now having hot flashes about 16 times per day and sweating overnight.  Her cholesterol went up about 52 points.  She says she hasn't changed her diet and she is concerned it is related to the letrozole.   ? ? ?Patient Active Problem List  ? Diagnosis Date Noted  ? Malignant neoplasm of upper-outer quadrant of left breast in female, estrogen receptor positive ([place]) 03/23/2021  ? ? ?is allergic to mushroom extract complex, amoxicillin-pot clavulanate, and meperidine hcl. ? ?MEDICAL HISTORY: ?Past Medical History:  ?Diagnosis Date  ? Arthritis   ? Breast cancer ([place])   ? left breast IDC  ? Hypertension   ? Neuromuscular disorder ([place])   ? neuropathy in feet  ? Seasonal allergies   ? Sleep apnea   ? uses CPAP nightly  ? ? ?SURGICAL HISTORY: ?Past Surgical History:  ?Procedure Laterality Date  ? BACK SURGERY    ? lower back  ? bilarteral knee replacements Bilateral   ? BREAST LUMPECTOMY WITH RADIOACTIVE SEED AND SENTINEL LYMPH NODE BIOPSY Left 04/02/2021  ? Procedure: LEFT BREAST LUMPECTOMY WITH RADIOACTIVE SEED AND SENTINEL LYMPH NODE BIOPSY;  Surgeon: Jovita Kussmaul, MD;  Location: [facility];  Service: General;  Laterality: Left;  120 MINUTES ROOM 2 IQ  ? CESAREAN SECTION    ? x 2  ? COLON SURGERY    ? DIAGNOSTIC LAPAROSCOPY    ? benign mass  ? DILATATION & CURETTAGE/HYSTEROSCOPY WITH MYOSURE  04/27/2018  ?  Procedure: DILATATION & CURETTAGE/HYSTEROSCOPY WITH MYOSURE;  Surgeon: Janyth Pupa, DO;  Location: [facility];  Service: Gynecology;;  ? DILATION AND CURETTAGE OF UTERUS    ? x 2   ? RADIOACTIVE SEED GUIDED AXILLARY SENTINEL LYMPH NODE Left 04/02/2021  ? Procedure: RADIOACTIVE SEED GUIDED AXILLARY SENTINEL LYMPH NODE DISSECTION;  Surgeon: Jovita Kussmaul, MD;  Location: [facility];  Service: General;  Laterality: Left;  ? remove pituitary gland     ? ? ?SOCIAL HISTORY: ?Social History  ? ?Socioeconomic History  ? Marital status: Married  ?  Spouse name: Jeneen Rinks  ? Number of children: 2  ? Years of education: Not on file  ? Highest education level: Not on file  ?Occupational History  ? Not on file  ?Tobacco Use  ? Smoking status: Never  ? Smokeless tobacco: Never  ?Vaping Use  ? Vaping Use: Never used  ?Substance and Sexual Activity  ? Alcohol use: Yes  ?  Comment: occasional  ? Drug use: Never  ? Sexual activity: Not Currently  ?  Birth control/protection: Post-menopausal  ?Other Topics Concern  ? Not on file  ?Social History Narrative  ? Not on file  ? ?Social Determinants of Health  ? ?Financial Resource Strain: Not on file  ?Food Insecurity: Not on file  ?Transportation Needs: Not on file  ?Physical Activity: Not on file  ?Stress: Not on file  ?Social Connections: Not on file  ?Intimate Partner Violence: Not on file  ? ? ?FAMILY HISTORY: ?History reviewed. No pertinent family history. ? ?Review of Systems  ?Constitutional:  Positive for fatigue. Negative for appetite change, chills, fever and unexpected weight change.  ?HENT:   Negative for hearing loss, lump/mass and trouble swallowing.   ?Eyes:  Negative for eye problems and icterus.  ?Respiratory:  Negative for chest tightness, cough and shortness of breath.   ?Cardiovascular:  Negative for chest pain, leg swelling and palpitations.  ?Gastrointestinal:  Negative for abdominal distention, abdominal pain, constipation, diarrhea, nausea and vomiting.  ?Endocrine: Negative for hot flashes.  ?Genitourinary:  Negative for difficulty urinating.   ?Musculoskeletal:  Negative for arthralgias.  ?Skin:  Negative for itching and rash.  ?Neurological:  Negative for dizziness, extremity weakness, headaches and numbness.  ?Hematological:  Negative for adenopathy. Does not bruise/bleed easily.  ?Psychiatric/Behavioral:  Negative for depression. The patient is not nervous/anxious.    ? ? ?PHYSICAL EXAMINATION ? ?ECOG  PERFORMANCE STATUS: 1 - Symptomatic but completely ambulatory ? ?Vitals:  ? 11/30/21 1211  ?BP: (!) 179/88  ?Pulse: 67  ?Resp: 18  ?Temp: 97.9 ?F (36.6 ?C)  ?SpO2: 100%  ? ? ?Physical Exam ?Constitutional:   ?   General: She is not in acute distress. ?   Appearance: Normal appearance. She is not toxic-appearing.  ?HENT:  ?   Head: Normocephalic and atraumatic.  ?Eyes:  ?   General: No scleral icterus. ?Cardiovascular:  ?   Rate and Rhythm: Normal rate and regular rhythm.  ?   Pulses: Normal pulses.  ?   Heart sounds: Normal heart sounds.  ?Pulmonary:  ?   Effort: Pulmonary effort is normal.  ?   Breath sounds: Normal breath sounds.  ?Abdominal:  ?   General: Abdomen is flat. Bowel sounds are normal. There is no distension.  ?   Palpations: Abdomen is soft.  ?   Tenderness: There is no abdominal tenderness.  ?Musculoskeletal:     ?   General: No swelling.  ?   Cervical back: Neck  supple.  ?Lymphadenopathy:  ?   Cervical: No cervical adenopathy.  ?Skin: ?   General: Skin is warm and dry.  ?   Findings: No rash.  ?Neurological:  ?   General: No focal deficit present.  ?   Mental Status: She is alert.  ?Psychiatric:     ?   Mood and Affect: Mood normal.     ?   Behavior: Behavior normal.  ? ? ?LABORATORY DATA: ? ?CBC ?   ?Component Value Date/Time  ? WBC 5.7 11/30/2021 1123  ? WBC 6.4 04/24/2018 1030  ? RBC 4.71 11/30/2021 1123  ? HGB 14.7 11/30/2021 1123  ? HCT 42.4 11/30/2021 1123  ? PLT 345 11/30/2021 1123  ? MCV 90.0 11/30/2021 1123  ? MCH 31.2 11/30/2021 1123  ? MCHC 34.7 11/30/2021 1123  ? RDW 12.1 11/30/2021 1123  ? LYMPHSABS 1.3 11/30/2021 1123  ? MONOABS 0.5 11/30/2021 1123  ? EOSABS 0.1 11/30/2021 1123  ? BASOSABS 0.0 11/30/2021 1123  ? ? ?CMP  ?   ?Component Value Date/Time  ? NA 139 11/30/2021 1123  ? K 3.7 11/30/2021 1123  ? CL 102 11/30/2021 1123  ? CO2 30 11/30/2021 1123  ? GLUCOSE 89 11/30/2021 1123  ? BUN 18 11/30/2021 1123  ? CREATININE 1.15 (H) 11/30/2021 1123  ? CALCIUM 11.0 (H) 11/30/2021 1123  ?  PROT 8.0 11/30/2021 1123  ? ALBUMIN 4.1 11/30/2021 1123  ? AST 22 11/30/2021 1123  ? ALT 20 11/30/2021 1123  ? ALKPHOS 64 11/30/2021 1123  ? BILITOT 0.4 11/30/2021 1123  ? GFRNONAA 51 (L) 11/30/2021 1123  ? GFRAA >60 0

## 2021-11-30 NOTE — Assessment & Plan Note (Signed)
04/02/2021:Left lumpectomy: Grade 2 IDC, 1.1 cm, with intermediate grade DCIS, margins negative, 1/4 lymph nodes positive with extracapsular extension, ER 70%, PR 40%, HER2 equal vocal negative FISH, Ki-67 15% ? ?MammaPrint: Low risk luminal type a  ?Adjuvant radiation: 05/07/2021-06/09/2021  ? ?Treatment plan: Adjuvant antiestrogen therapy with letrozole 2.5 mg daily x5 to 7 years--changed to anastrozole 12/07/2021 ?? ?Christy Strickland has struggled being on letrozole.  I recommended that she take another week off the therapy and then start anastrozole.  For her cholesterol recommended continue healthy diet and exercise.  She should repeat her cholesterol in 6 months if still elevated would consider going on medication--so long as her primary care provider is comfortable with that approach. ? ?Christy Strickland has follow-up with Dr. Lindi Adie in a few months and he will see how she is doing on this medication change.  She knows to call us if she has any questions or concerns in the meantime. ? ?

## 2021-12-17 DIAGNOSIS — U071 COVID-19: Secondary | ICD-10-CM | POA: Diagnosis not present

## 2021-12-17 IMAGING — US US AXILLARY NODE CORE BIOPSY LEFT
1 series · 6 of 6 positions shown · non-contrast
Comparison: Previous exam(s).
COMPARISON: Previous exam(s).

Addendum:
CLINICAL DATA: Biopsy of a left breast mass at 2 o'clock, 5 cm from
the nipple and a left axillary lymph node.

EXAM:
ULTRASOUND GUIDED LEFT BREAST CORE NEEDLE BIOPSY

[Series 1: us axillary node core biopsy left · 0.07mm/px · 6 of 6 slices shown]
[im 1/6]
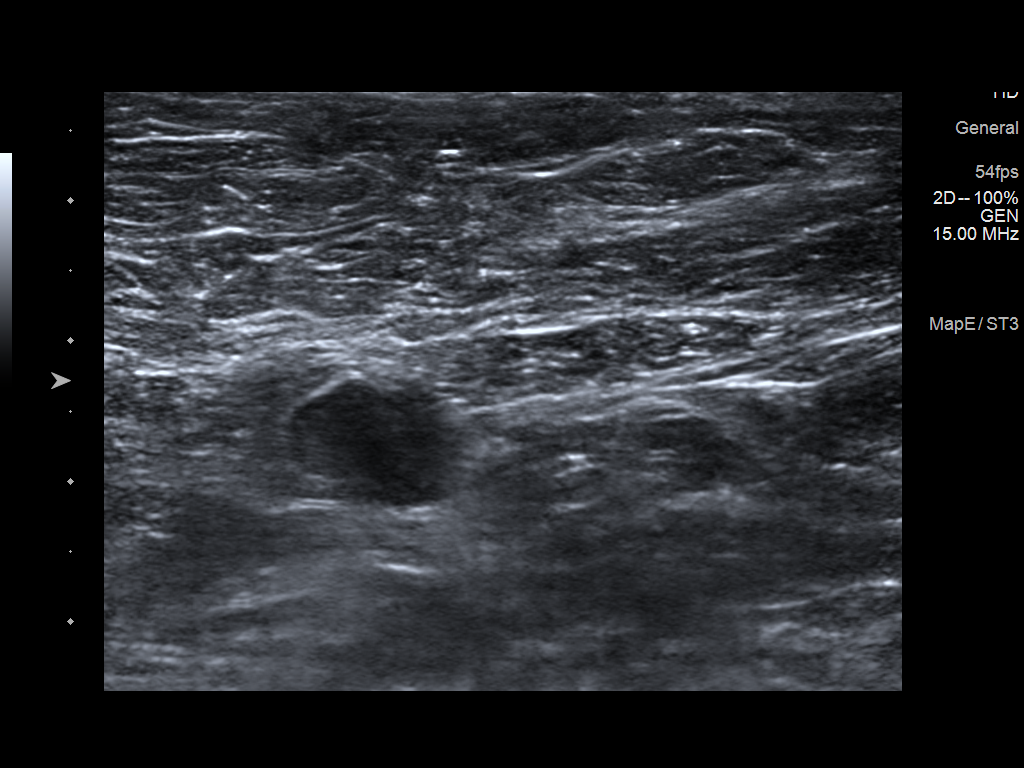
[im 2/6]
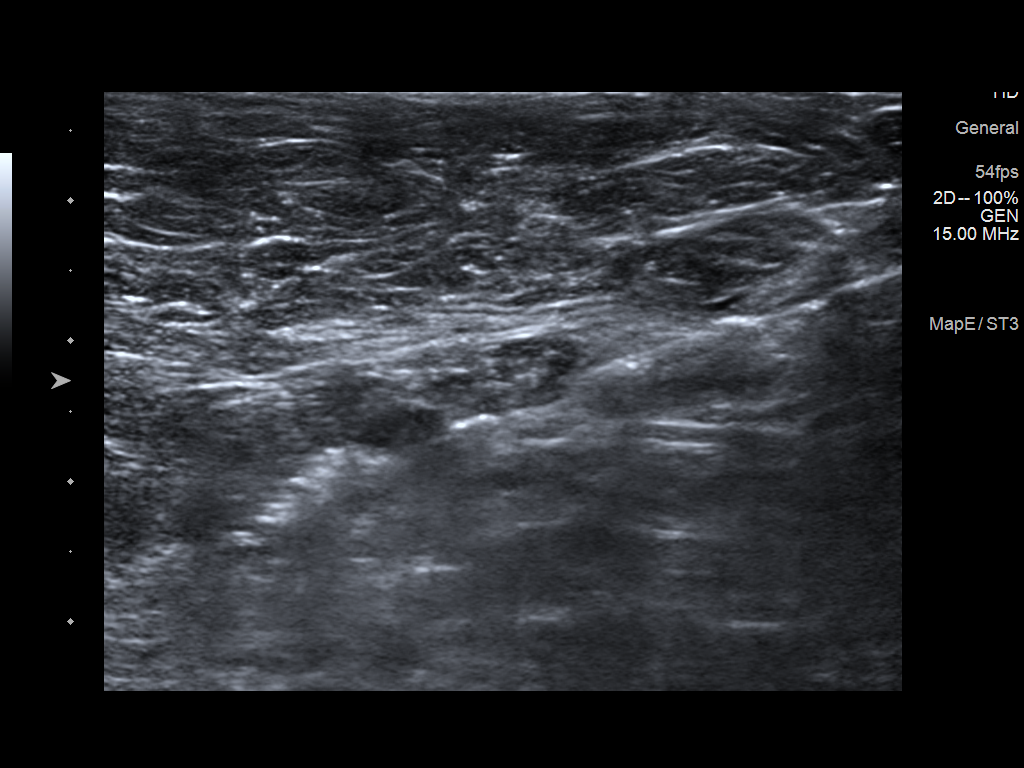
[im 3/6]
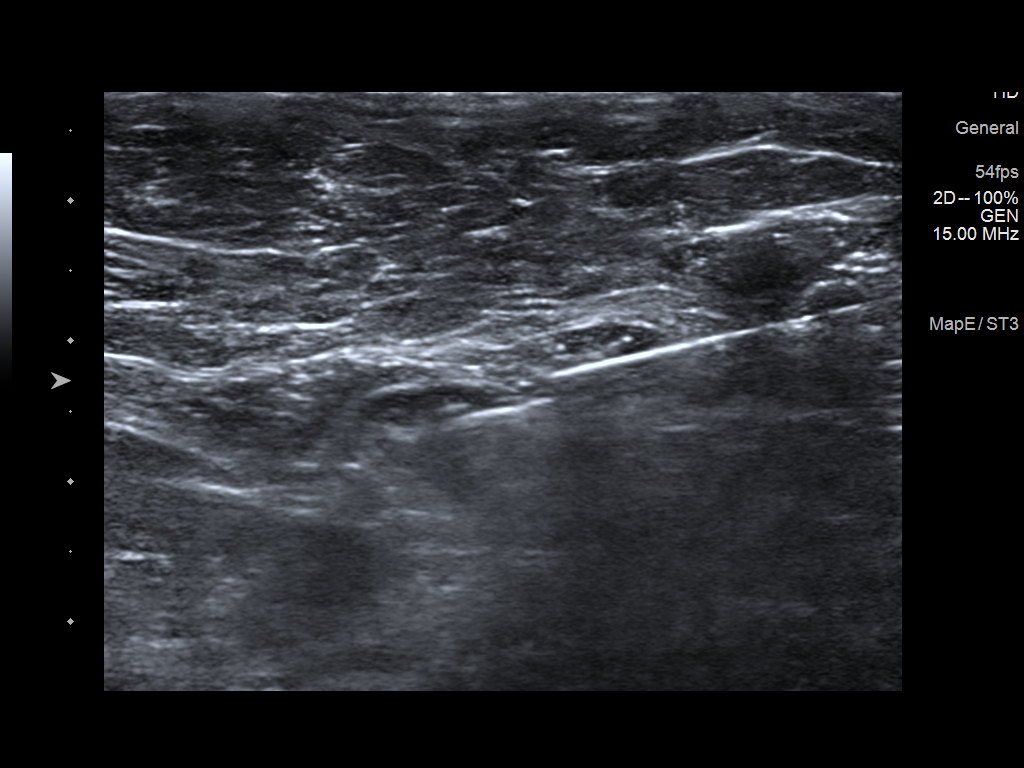
[im 4/6]
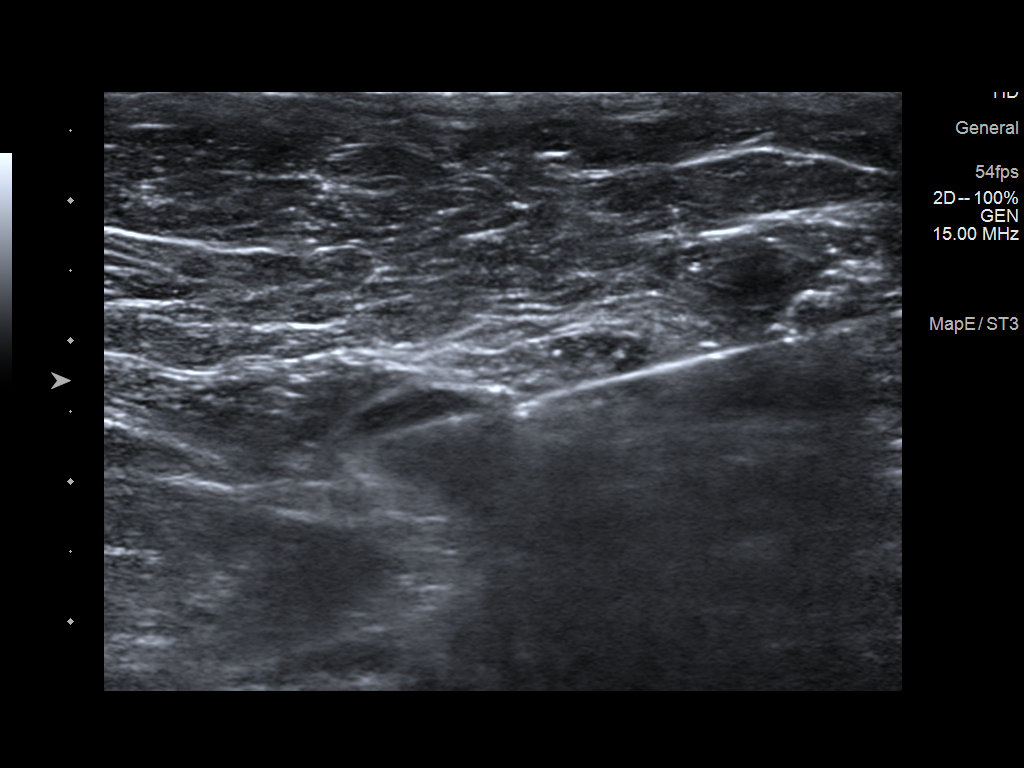
[im 5/6]
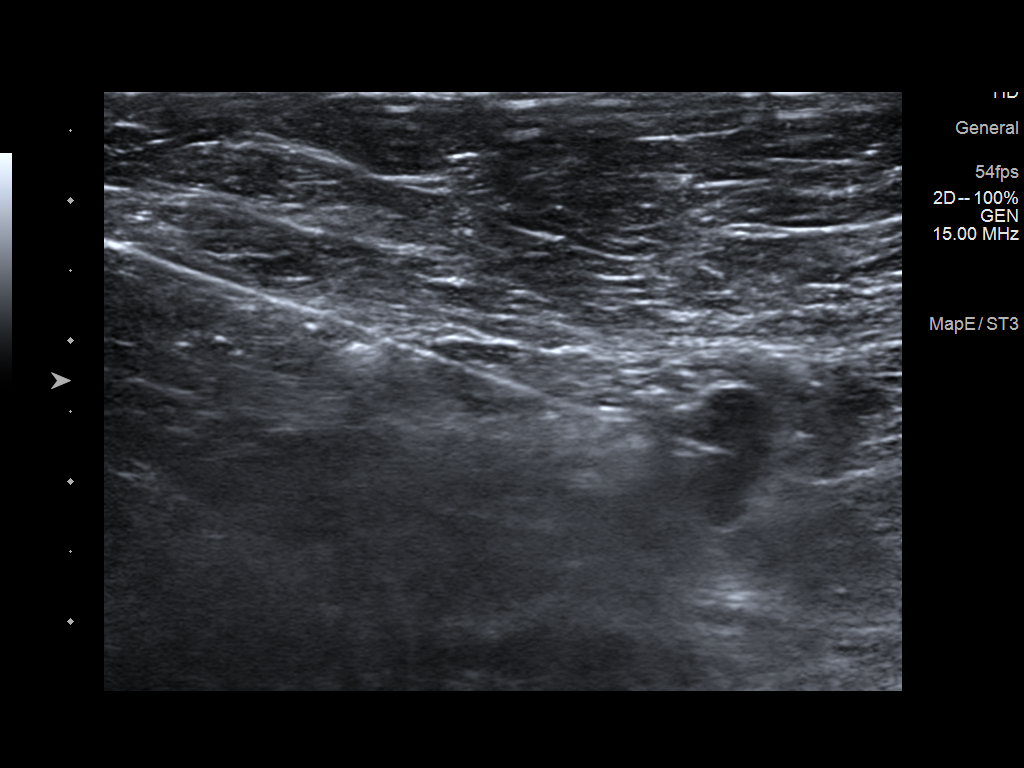
[im 6/6]
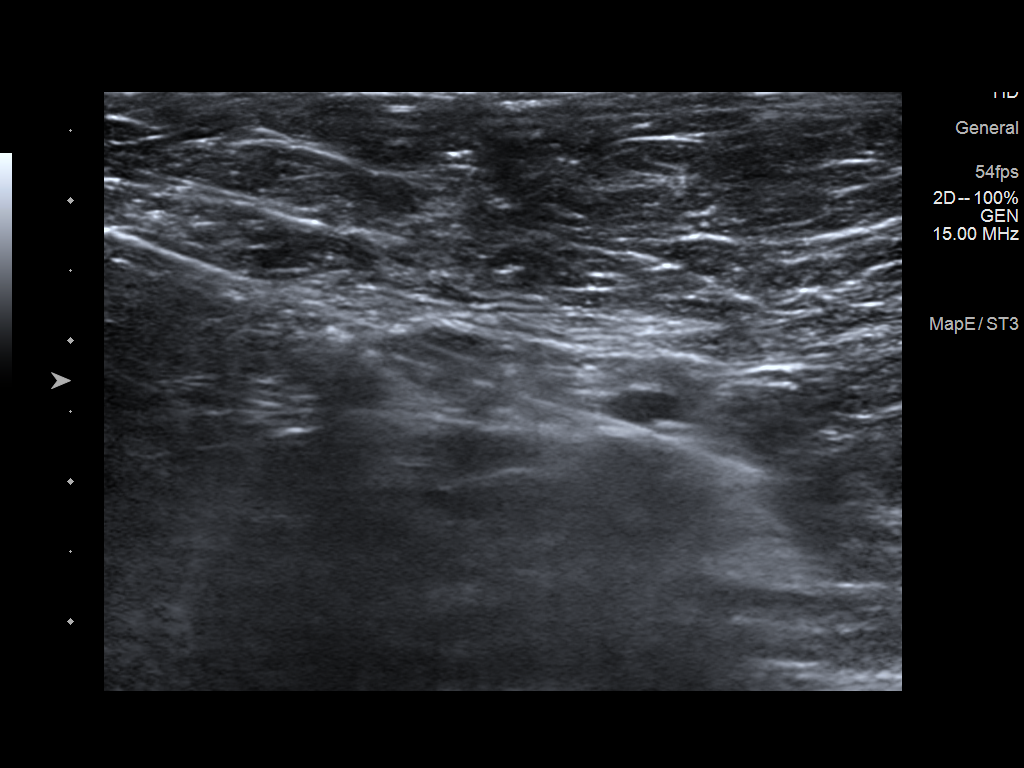

[6 of 6 positions shown; findings below may reference images not displayed]



Lesion quadrant: Left breast mass at 2 o'clock

Using sterile technique and 1% Lidocaine as local anesthetic, under
direct ultrasound visualization, a 12 gauge Proxy device was
used to perform biopsy of a 2 o'clock left breast mass using a
lateral approach. At the conclusion of the procedure a ribbon shaped
tissue marker clip was deployed into the biopsy cavity. Follow up 2
view mammogram was performed and dictated separately.

Lesion quadrant: Left axillary lymph node

Using sterile technique and 1% Lidocaine as local anesthetic, under
direct ultrasound visualization, a 14 gauge Proxy device was
used to perform biopsy of a left axillary lymph node using a lateral
approach. At the conclusion of the procedure Alia Tiger tissue
marker clip was deployed into the biopsy cavity. Follow up 2 view
mammogram was performed and dictated separately.
IMPRESSION: Ultrasound guided biopsy of a 2 o'clock left breast mass and a left
axillary lymph node. No apparent complications.

ADDENDUM:
Pathology revealed GRADE II INVASIVE DUCTAL CARCINOMA, DUCTAL
CARCINOMA IN SITU of the Left breast, 2:00 o'clock, (ribbon clip).
This was found to be concordant by Dr. Rtoyota Joshjax.

Pathology revealed METASTATIC CARCINOMA IN A LYMPH NODE of the Left
axilla, (tribell clip). This was found to be concordant by Dr. Postell
Geffrard.

Pathology results were discussed with the patient by telephone. The
patient reported doing well after the biopsies with tenderness at
the sites. Post biopsy instructions and care were reviewed and
questions were answered. The patient was encouraged to call The
direct phone number was provided.

The patient was referred to [REDACTED]
[REDACTED] at [REDACTED] on
March 25, 2021.

Pathology results reported by Joshoa Scarlett, RN on 03/17/2021.



Lesion quadrant: Left breast mass at 2 o'clock

Using sterile technique and 1% Lidocaine as local anesthetic, under
direct ultrasound visualization, a 12 gauge Proxy device was
used to perform biopsy of a 2 o'clock left breast mass using a
lateral approach. At the conclusion of the procedure a ribbon shaped
tissue marker clip was deployed into the biopsy cavity. Follow up 2
view mammogram was performed and dictated separately.

Lesion quadrant: Left axillary lymph node

Using sterile technique and 1% Lidocaine as local anesthetic, under
direct ultrasound visualization, a 14 gauge Proxy device was
used to perform biopsy of a left axillary lymph node using a lateral
approach. At the conclusion of the procedure Alia Tiger tissue
marker clip was deployed into the biopsy cavity. Follow up 2 view
mammogram was performed and dictated separately.
IMPRESSION: Ultrasound guided biopsy of a 2 o'clock left breast mass and a left
axillary lymph node. No apparent complications.

## 2021-12-21 DIAGNOSIS — Z1211 Encounter for screening for malignant neoplasm of colon: Secondary | ICD-10-CM | POA: Diagnosis not present

## 2021-12-21 DIAGNOSIS — Z23 Encounter for immunization: Secondary | ICD-10-CM | POA: Diagnosis not present

## 2021-12-28 DIAGNOSIS — R300 Dysuria: Secondary | ICD-10-CM | POA: Diagnosis not present

## 2021-12-29 ENCOUNTER — Other Ambulatory Visit: Payer: Self-pay

## 2021-12-29 DIAGNOSIS — Z170 Estrogen receptor positive status [ER+]: Secondary | ICD-10-CM

## 2021-12-30 ENCOUNTER — Other Ambulatory Visit: Payer: Self-pay

## 2021-12-30 ENCOUNTER — Inpatient Hospital Stay: Payer: Medicare Other | Attending: Adult Health

## 2021-12-30 DIAGNOSIS — Z170 Estrogen receptor positive status [ER+]: Secondary | ICD-10-CM | POA: Diagnosis not present

## 2021-12-30 DIAGNOSIS — C50412 Malignant neoplasm of upper-outer quadrant of left female breast: Secondary | ICD-10-CM | POA: Insufficient documentation

## 2021-12-30 LAB — CBC WITH DIFFERENTIAL (CANCER CENTER ONLY)
Abs Immature Granulocytes: 0.02 10*3/uL (ref 0.00–0.07)
Basophils Absolute: 0.1 10*3/uL (ref 0.0–0.1)
Basophils Relative: 1 %
Eosinophils Absolute: 0.2 10*3/uL (ref 0.0–0.5)
Eosinophils Relative: 3 %
HCT: 41.3 % (ref 36.0–46.0)
Hemoglobin: 14.3 g/dL (ref 12.0–15.0)
Immature Granulocytes: 0 %
Lymphocytes Relative: 29 %
Lymphs Abs: 1.8 10*3/uL (ref 0.7–4.0)
MCH: 31 pg (ref 26.0–34.0)
MCHC: 34.6 g/dL (ref 30.0–36.0)
MCV: 89.6 fL (ref 80.0–100.0)
Monocytes Absolute: 0.6 10*3/uL (ref 0.1–1.0)
Monocytes Relative: 9 %
Neutro Abs: 3.6 10*3/uL (ref 1.7–7.7)
Neutrophils Relative %: 58 %
Platelet Count: 301 10*3/uL (ref 150–400)
RBC: 4.61 MIL/uL (ref 3.87–5.11)
RDW: 12.6 % (ref 11.5–15.5)
Smear Review: NORMAL
WBC Count: 6.2 10*3/uL (ref 4.0–10.5)
nRBC: 0 % (ref 0.0–0.2)

## 2021-12-30 LAB — CMP (CANCER CENTER ONLY)
ALT: 20 U/L (ref 0–44)
AST: 21 U/L (ref 15–41)
Albumin: 4 g/dL (ref 3.5–5.0)
Alkaline Phosphatase: 71 U/L (ref 38–126)
Anion gap: 5 (ref 5–15)
BUN: 23 mg/dL (ref 8–23)
CO2: 32 mmol/L (ref 22–32)
Calcium: 10.6 mg/dL — ABNORMAL HIGH (ref 8.9–10.3)
Chloride: 103 mmol/L (ref 98–111)
Creatinine: 1.16 mg/dL — ABNORMAL HIGH (ref 0.44–1.00)
GFR, Estimated: 50 mL/min — ABNORMAL LOW (ref >60)
Glucose, Bld: 89 mg/dL (ref 70–99)
Potassium: 3.9 mmol/L (ref 3.5–5.1)
Sodium: 140 mmol/L (ref 135–145)
Total Bilirubin: 0.4 mg/dL (ref 0.3–1.2)
Total Protein: 7.9 g/dL (ref 6.5–8.1)

## 2022-02-01 ENCOUNTER — Ambulatory Visit: Payer: Medicare Other | Attending: General Surgery

## 2022-02-01 VITALS — Wt 271.5 lb

## 2022-02-01 DIAGNOSIS — Z483 Aftercare following surgery for neoplasm: Secondary | ICD-10-CM | POA: Insufficient documentation

## 2022-02-05 DIAGNOSIS — L570 Actinic keratosis: Secondary | ICD-10-CM | POA: Diagnosis not present

## 2022-02-05 DIAGNOSIS — L578 Other skin changes due to chronic exposure to nonionizing radiation: Secondary | ICD-10-CM | POA: Diagnosis not present

## 2022-02-05 DIAGNOSIS — L821 Other seborrheic keratosis: Secondary | ICD-10-CM | POA: Diagnosis not present

## 2022-02-16 ENCOUNTER — Encounter: Payer: Self-pay | Admitting: Hematology and Oncology

## 2022-02-18 ENCOUNTER — Telehealth: Payer: Self-pay | Admitting: Hematology and Oncology

## 2022-02-18 NOTE — Telephone Encounter (Signed)
.  Called patient to schedule appointment per 7/11 inbasket, patient is aware of date and time.   

## 2022-03-03 DIAGNOSIS — R300 Dysuria: Secondary | ICD-10-CM | POA: Diagnosis not present

## 2022-03-04 ENCOUNTER — Other Ambulatory Visit: Payer: Self-pay

## 2022-03-04 ENCOUNTER — Inpatient Hospital Stay: Payer: Medicare Other | Attending: Adult Health | Admitting: Adult Health

## 2022-03-04 ENCOUNTER — Encounter: Payer: Self-pay | Admitting: Adult Health

## 2022-03-04 VITALS — BP 153/83 | HR 66 | Temp 97.7°F | Resp 18 | Ht 67.0 in | Wt 266.2 lb

## 2022-03-04 DIAGNOSIS — M2550 Pain in unspecified joint: Secondary | ICD-10-CM | POA: Insufficient documentation

## 2022-03-04 DIAGNOSIS — R300 Dysuria: Secondary | ICD-10-CM | POA: Insufficient documentation

## 2022-03-04 DIAGNOSIS — R238 Other skin changes: Secondary | ICD-10-CM | POA: Insufficient documentation

## 2022-03-04 DIAGNOSIS — Z79899 Other long term (current) drug therapy: Secondary | ICD-10-CM | POA: Insufficient documentation

## 2022-03-04 DIAGNOSIS — C50412 Malignant neoplasm of upper-outer quadrant of left female breast: Secondary | ICD-10-CM | POA: Diagnosis not present

## 2022-03-04 DIAGNOSIS — Z170 Estrogen receptor positive status [ER+]: Secondary | ICD-10-CM | POA: Diagnosis not present

## 2022-03-04 DIAGNOSIS — R61 Generalized hyperhidrosis: Secondary | ICD-10-CM | POA: Diagnosis not present

## 2022-03-04 DIAGNOSIS — R5383 Other fatigue: Secondary | ICD-10-CM | POA: Diagnosis not present

## 2022-03-04 DIAGNOSIS — Z79811 Long term (current) use of aromatase inhibitors: Secondary | ICD-10-CM | POA: Insufficient documentation

## 2022-03-04 DIAGNOSIS — R200 Anesthesia of skin: Secondary | ICD-10-CM | POA: Insufficient documentation

## 2022-03-04 DIAGNOSIS — E785 Hyperlipidemia, unspecified: Secondary | ICD-10-CM | POA: Diagnosis not present

## 2022-03-04 DIAGNOSIS — I10 Essential (primary) hypertension: Secondary | ICD-10-CM | POA: Insufficient documentation

## 2022-03-04 MED ORDER — TAMOXIFEN CITRATE 10 MG PO TABS: 10.0000 mg | ORAL_TABLET | Freq: Two times a day (BID) | ORAL | 1 refills | Status: DC

## 2022-03-04 NOTE — Progress Notes (Signed)
[facility] Follow up:    Christy Melter, MD [address] [place] [address]   DIAGNOSIS:  Cancer Staging  Malignant neoplasm of upper-outer quadrant of left breast in female, estrogen receptor positive ([place]) Staging form: Breast, AJCC 8th Edition - Clinical stage from 03/25/2021: Stage IB (cT1c, cN1, cM0, G2, ER+, PR+, HER2-) - Signed by Nicholas Lose, MD on 03/25/2021 Stage prefix: Initial diagnosis Histologic grading system: 3 grade system   SUMMARY OF ONCOLOGIC HISTORY: Oncology History  Malignant neoplasm of upper-outer quadrant of left breast in female, estrogen receptor positive ([place])  03/23/2021 Initial Diagnosis   Screening mammogram: a possible mass and abnormal lymph node in the left breast. Diagnostic mammogram and [place]: suspicious 1.1 cm mass at the left breast 2 o'clock position and enlarged left axillary lymph node. 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  ER 70%, PR 40%, Ki-67 15%, HER2 negative by [facility]   03/25/2021 Cancer Staging   Staging form: Breast, AJCC 8th Edition - Clinical stage from 03/25/2021: Stage IB (cT1c, cN1, cM0, G2, ER+, PR+, HER2-) - Signed by Nicholas Lose, MD on 03/25/2021 Stage prefix: Initial diagnosis Histologic grading system: 3 grade system   04/02/2021 Surgery   Left lumpectomy: Grade 2 IDC, 1.1 cm, with intermediate grade DCIS, margins negative, 1/4 lymph nodes positive with extracapsular extension, ER 70%, PR 40%, HER2 equal vocal negative FISH, Ki-67 15%   04/02/2021 Miscellaneous   Mammaprint: Low risk   05/07/2021 - 06/09/2021 Radiation Therapy   Adjuvant radiation   06/24/2021 -  Anti-estrogen oral therapy   Adjuvant antiestrogen therapy with letrozole 2.5 mg daily     CURRENT THERAPY: Letrozole changed to Anastrozole  INTERVAL HISTORY: Christy Strickland 71 y.o. female returns for follow-up of her breast cancer she was taking letrozole daily  and this was changed to anastrozole in May 2023.  She is here today to talk about how doing on this treatment. She was unable to tolerate anastrozole and has been off of the anastrozole x 2 weeks.    She developed numbness in her first digit finger pads accompanied by some bluing of her skin.  The skin color change has improved since stopping Anastrozole.  She had increased night sweats.  She also had increased arthralgias that are improved since stopping therapy.  She has experienced an increased number of urinary tract infections.  She has had three in the past 8 weeks requiring three courses of antibiotics.  She just finished her most recent course of treatment.   Patient Active Problem List   Diagnosis Date Noted   Hyperlipidemia 03/04/2022   HTN (hypertension) 03/04/2022   Malignant neoplasm of upper-outer quadrant of left breast in female, estrogen receptor positive ([place]) 03/23/2021   Cervical radiculopathy 09/06/2017   Hirsutism 06/03/2010   Hyperprolactinemia ([place]) 06/03/2010    is allergic to mushroom extract complex, amoxicillin-pot clavulanate, and meperidine hcl.  MEDICAL HISTORY: Past Medical History:  Diagnosis Date   Arthritis    Breast cancer ([place])    left breast IDC   Hypertension    Neuromuscular disorder ([facility])    neuropathy in feet   Seasonal allergies    Sleep apnea    uses CPAP nightly    SURGICAL HISTORY: Past Surgical History:  Procedure Laterality Date   BACK SURGERY     lower back   bilarteral knee replacements Bilateral    BREAST LUMPECTOMY WITH RADIOACTIVE SEED AND SENTINEL LYMPH  NODE BIOPSY Left 04/02/2021   Procedure: LEFT BREAST LUMPECTOMY WITH RADIOACTIVE SEED AND SENTINEL LYMPH NODE BIOPSY;  Surgeon: Jovita Kussmaul, MD;  Location: [facility];  Service: General;  Laterality: Left;  120 MINUTES ROOM 2 IQ   CESAREAN SECTION     x 2   COLON SURGERY     DIAGNOSTIC LAPAROSCOPY     benign mass   DILATATION & CURETTAGE/HYSTEROSCOPY  WITH MYOSURE  04/27/2018   Procedure: DILATATION & CURETTAGE/HYSTEROSCOPY WITH MYOSURE;  Surgeon: Janyth Pupa, DO;  Location: [facility];  Service: Gynecology;;   DILATION AND CURETTAGE OF UTERUS     x 2    RADIOACTIVE SEED GUIDED AXILLARY SENTINEL LYMPH NODE Left 04/02/2021   Procedure: RADIOACTIVE SEED GUIDED AXILLARY SENTINEL LYMPH NODE DISSECTION;  Surgeon: Jovita Kussmaul, MD;  Location: [facility];  Service: General;  Laterality: Left;   remove pituitary gland      SOCIAL HISTORY: Social History   Socioeconomic History   Marital status: Married    Spouse name: Jeneen Rinks   Number of children: 2   Years of education: Not on file   Highest education level: Not on file  Occupational History   Not on file  Tobacco Use   Smoking status: Never   Smokeless tobacco: Never  Vaping Use   Vaping Use: Never used  Substance and Sexual Activity   Alcohol use: Yes    Comment: occasional   Drug use: Never   Sexual activity: Not Currently    Birth control/protection: Post-menopausal  Other Topics Concern   Not on file  Social History Narrative   Not on file   Social Determinants of Health   Financial Resource Strain: Not on file  Food Insecurity: Not on file  Transportation Needs: Not on file  Physical Activity: Not on file  Stress: Not on file  Social Connections: Not on file  Intimate Partner Violence: Not on file    FAMILY HISTORY: Non-Contributory  Review of Systems  Constitutional:  Positive for fatigue. Negative for appetite change, chills, fever and unexpected weight change.  HENT:   Negative for hearing loss, lump/mass and trouble swallowing.   Eyes:  Negative for eye problems and icterus.  Respiratory:  Negative for chest tightness, cough and shortness of breath.   Cardiovascular:  Negative for chest pain, leg swelling and palpitations.  Gastrointestinal:  Negative for abdominal distention, abdominal pain, constipation, diarrhea, nausea and vomiting.   Endocrine: Positive for hot flashes.  Genitourinary:  Positive for dysuria. Negative for difficulty urinating.   Musculoskeletal:  Positive for arthralgias.  Skin:  Negative for itching and rash.  Neurological:  Positive for numbness. Negative for dizziness, extremity weakness and headaches.  Hematological:  Negative for adenopathy. Does not bruise/bleed easily.  Psychiatric/Behavioral:  Negative for depression. The patient is not nervous/anxious.       PHYSICAL EXAMINATION  ECOG PERFORMANCE STATUS: 1 - Symptomatic but completely ambulatory  Vitals:   03/04/22 1046  BP: (!) 153/83  Pulse: 66  Resp: 18  Temp: 97.7 F (36.5 C)  SpO2: 98%    Physical Exam Constitutional:      Appearance: Normal appearance.  HENT:     Head: Normocephalic and atraumatic.  Eyes:     General: No scleral icterus. Skin:    Findings: No rash.  Neurological:     General: No focal deficit present.     Mental Status: She is alert.  Psychiatric:        Mood and  Affect: Mood normal.        Behavior: Behavior normal.     LABORATORY DATA:  None for this visit.   ASSESSMENT and THERAPY PLAN:   Malignant neoplasm of upper-outer quadrant of left breast in female, estrogen receptor positive ([facility]) Christy Strickland is a 71 year old woman with stage Ib estrogen positive breast cancer, status post lumpectomy, adjuvant radiation, and antiestrogen therapy.  She has been unable to tolerate letrozole, and anastrozole.  We discussed risks and benefits of tamoxifen oral therapy.  I suggested she started at 10 mg a day for several weeks to give herself some time to adjust to the therapy.  When she tolerates the therapy she can increase to 20 mg a day.  She will follow-up with Dr. Lindi Adie in September 2023 to discuss how she is tolerating it. Does have a mild anemia and we will recheck labs to see where that is prior to that visit.   All questions were answered. The patient knows to call the clinic with any problems,  questions or concerns. We can certainly see the patient much sooner if necessary.  Total encounter time:20 minutes*in face-to-face visit time, chart review, lab review, care coordination, order entry, and documentation of the encounter time.  Wilber Bihari, NP 03/07/22 12:34 PM Medical Oncology and Hematology [facility] [place], [place] [address] Tel. [phone]    Fax. [phone]  *Total Encounter Time as defined by the Centers for Medicare and Medicaid Services includes, in addition to the face-to-face time of a patient visit (documented in the note above) non-face-to-face time: obtaining and reviewing outside history, ordering and reviewing medications, tests or procedures, care coordination (communications with other health care professionals or caregivers) and documentation in the medical record.

## 2022-03-05 ENCOUNTER — Telehealth: Payer: Self-pay | Admitting: Adult Health

## 2022-03-05 NOTE — Telephone Encounter (Signed)
Scheduled appointment per 7/27 los. Patient is aware.

## 2022-03-07 NOTE — Assessment & Plan Note (Signed)
Christy Strickland is a 71 year old woman with stage Ib estrogen positive breast cancer, status post lumpectomy, adjuvant radiation, and antiestrogen therapy.  She has been unable to tolerate letrozole, and anastrozole.  We discussed risks and benefits of tamoxifen oral therapy.  I suggested she started at 10 mg a day for several weeks to give herself some time to adjust to the therapy.  When she tolerates the therapy she can increase to 20 mg a day.  She will follow-up with Dr. Lindi Adie in September 2023 to discuss how she is tolerating it. Does have a mild anemia and we will recheck labs to see where that is prior to that visit.

## 2022-03-08 ENCOUNTER — Ambulatory Visit
Admission: RE | Admit: 2022-03-08 | Discharge: 2022-03-08 | Disposition: A | Discharge status: Home / Self Care | Payer: Medicare Other | Source: Ambulatory Visit | Attending: Adult Health | Admitting: Adult Health

## 2022-03-08 DIAGNOSIS — R928 Other abnormal and inconclusive findings on diagnostic imaging of breast: Secondary | ICD-10-CM | POA: Diagnosis not present

## 2022-03-08 DIAGNOSIS — C50412 Malignant neoplasm of upper-outer quadrant of left female breast: Secondary | ICD-10-CM

## 2022-03-08 DIAGNOSIS — Z853 Personal history of malignant neoplasm of breast: Secondary | ICD-10-CM | POA: Diagnosis not present

## 2022-03-22 ENCOUNTER — Encounter: Payer: Self-pay | Admitting: Adult Health

## 2022-03-26 ENCOUNTER — Other Ambulatory Visit: Payer: Self-pay | Admitting: Adult Health

## 2022-03-26 DIAGNOSIS — Z170 Estrogen receptor positive status [ER+]: Secondary | ICD-10-CM

## 2022-04-01 ENCOUNTER — Telehealth: Payer: Self-pay | Admitting: Hematology and Oncology

## 2022-04-01 NOTE — Telephone Encounter (Signed)
Scheduled appointment per provider PAL. Patient is aware of the changes made to her upcoming appointment.  

## 2022-04-20 DIAGNOSIS — C50412 Malignant neoplasm of upper-outer quadrant of left female breast: Secondary | ICD-10-CM | POA: Diagnosis not present

## 2022-04-20 DIAGNOSIS — Z170 Estrogen receptor positive status [ER+]: Secondary | ICD-10-CM | POA: Diagnosis not present

## 2022-04-21 ENCOUNTER — Other Ambulatory Visit: Payer: Medicare Other

## 2022-04-21 ENCOUNTER — Ambulatory Visit: Payer: Medicare Other | Admitting: Hematology and Oncology

## 2022-04-28 ENCOUNTER — Ambulatory Visit: Payer: Medicare Other | Admitting: Hematology and Oncology

## 2022-05-01 NOTE — Progress Notes (Signed)
Patient Care Team: Orpah Melter, MD as PCP - General (Family Medicine) Jovita Kussmaul, MD as Consulting Physician (General Surgery) Nicholas Lose, MD as Consulting Physician (Hematology and Oncology) Eppie Gibson, MD as Attending Physician (Radiation Oncology)  DIAGNOSIS: No diagnosis found.  SUMMARY OF ONCOLOGIC HISTORY: Oncology History  Malignant neoplasm of upper-outer quadrant of left breast in female, estrogen receptor positive ([place])  03/23/2021 Initial Diagnosis   Screening mammogram: a possible mass and abnormal lymph node in the left breast. Diagnostic mammogram and [place]: suspicious 1.1 cm mass at the left breast 2 o'clock position and enlarged left axillary lymph node. 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  ER 70%, PR 40%, Ki-67 15%, HER2 negative by [facility]   03/25/2021 Cancer Staging   Staging form: Breast, AJCC 8th Edition - Clinical stage from 03/25/2021: Stage IB (cT1c, cN1, cM0, G2, ER+, PR+, HER2-) - Signed by Nicholas Lose, MD on 03/25/2021 Stage prefix: Initial diagnosis Histologic grading system: 3 grade system   04/02/2021 Surgery   Left lumpectomy: Grade 2 IDC, 1.1 cm, with intermediate grade DCIS, margins negative, 1/4 lymph nodes positive with extracapsular extension, ER 70%, PR 40%, HER2 equal vocal negative FISH, Ki-67 15%   04/02/2021 Miscellaneous   Mammaprint: Low risk   05/07/2021 - 06/09/2021 Radiation Therapy   Adjuvant radiation   06/24/2021 -  Anti-estrogen oral therapy   Adjuvant antiestrogen therapy with letrozole 2.5 mg daily     CHIEF COMPLIANT:   INTERVAL HISTORY: Christy Strickland is a   ALLERGIES:  is allergic to mushroom extract complex, amoxicillin-pot clavulanate, and meperidine hcl.  MEDICATIONS:  Current Outpatient Medications  Medication Sig Dispense Refill   cetirizine (ZYRTEC) 10 MG tablet Take 10 mg by mouth daily.     hydrochlorothiazide (HYDRODIURIL) 25 MG tablet  Take 25 mg by mouth daily.     Multiple Vitamin (MULTIVITAMIN WITH MINERALS) TABS tablet Take 1 tablet by mouth daily.     naproxen (NAPROSYN) 500 MG tablet Take 500 mg by mouth as needed. (Patient not taking: Reported on 09/25/2021)     tamoxifen (NOLVADEX) 10 MG tablet TAKE 1 TABLET BY MOUTH TWICE A DAY 60 tablet 1   vitamin C (ASCORBIC ACID) 250 MG tablet Take 250 mg by mouth daily.     No current facility-administered medications for this visit.    PHYSICAL EXAMINATION: ECOG PERFORMANCE STATUS: {CHL ONC ECOG PS:[phone]}  There were no vitals filed for this visit. There were no vitals filed for this visit.  BREAST:*** No palpable masses or nodules in either right or left breasts. No palpable axillary supraclavicular or infraclavicular adenopathy no breast tenderness or nipple discharge. (exam performed in the presence of a chaperone)  LABORATORY DATA:  I have reviewed the data as listed    Latest Ref Rng & Units 12/30/2021   11:02 AM 11/30/2021   11:23 AM 03/30/2021   11:16 AM  CMP  Glucose 70 - 99 mg/dL 89  89  88   BUN 8 - 23 mg/dL 23  18  12    Creatinine 0.44 - 1.00 mg/dL 1.16  1.15  1.04   Sodium 135 - 145 mmol/L 140  139  140   Potassium 3.5 - 5.1 mmol/L 3.9  3.7  3.7   Chloride 98 - 111 mmol/L 103  102  103   CO2 22 - 32 mmol/L 32  30  27   Calcium 8.9 - 10.3 mg/dL 10.6  11.0  10.2  Total Protein 6.5 - 8.1 g/dL 7.9  8.0    Total Bilirubin 0.3 - 1.2 mg/dL 0.4  0.4    Alkaline Phos 38 - 126 U/L 71  64    AST 15 - 41 U/L 21  22    ALT 0 - 44 U/L 20  20      Lab Results  Component Value Date   WBC 6.2 12/30/2021   HGB 14.3 12/30/2021   HCT 41.3 12/30/2021   MCV 89.6 12/30/2021   PLT 301 12/30/2021   NEUTROABS 3.6 12/30/2021    ASSESSMENT & PLAN:  No problem-specific Assessment & Plan notes found for this encounter.    No orders of the defined types were placed in this encounter.  The patient has a good understanding of the overall plan. she agrees with  it. she will call with any problems that may develop before the next visit here. Total time spent: 30 mins including face to face time and time spent for planning, charting and co-ordination of care   Suzzette Righter, [place] 05/01/22    I Gardiner Coins am scribing for Dr. Lindi Adie  ***

## 2022-05-04 ENCOUNTER — Inpatient Hospital Stay (HOSPITAL_BASED_OUTPATIENT_CLINIC_OR_DEPARTMENT_OTHER): Payer: Medicare Other | Admitting: Hematology and Oncology

## 2022-05-04 ENCOUNTER — Inpatient Hospital Stay: Payer: Medicare Other | Attending: Adult Health

## 2022-05-04 ENCOUNTER — Other Ambulatory Visit: Payer: Self-pay

## 2022-05-04 DIAGNOSIS — Z170 Estrogen receptor positive status [ER+]: Secondary | ICD-10-CM | POA: Insufficient documentation

## 2022-05-04 DIAGNOSIS — Z79899 Other long term (current) drug therapy: Secondary | ICD-10-CM | POA: Insufficient documentation

## 2022-05-04 DIAGNOSIS — Z79811 Long term (current) use of aromatase inhibitors: Secondary | ICD-10-CM | POA: Diagnosis not present

## 2022-05-04 DIAGNOSIS — C50412 Malignant neoplasm of upper-outer quadrant of left female breast: Secondary | ICD-10-CM | POA: Diagnosis not present

## 2022-05-04 LAB — CBC WITH DIFFERENTIAL (CANCER CENTER ONLY)
Abs Immature Granulocytes: 0.01 10*3/uL (ref 0.00–0.07)
Basophils Absolute: 0.1 10*3/uL (ref 0.0–0.1)
Basophils Relative: 1 %
Eosinophils Absolute: 0.1 10*3/uL (ref 0.0–0.5)
Eosinophils Relative: 3 %
HCT: 41.5 % (ref 36.0–46.0)
Hemoglobin: 14.5 g/dL (ref 12.0–15.0)
Immature Granulocytes: 0 %
Lymphocytes Relative: 27 %
Lymphs Abs: 1.5 10*3/uL (ref 0.7–4.0)
MCH: 31.3 pg (ref 26.0–34.0)
MCHC: 34.9 g/dL (ref 30.0–36.0)
MCV: 89.4 fL (ref 80.0–100.0)
Monocytes Absolute: 0.5 10*3/uL (ref 0.1–1.0)
Monocytes Relative: 9 %
Neutro Abs: 3.3 10*3/uL (ref 1.7–7.7)
Neutrophils Relative %: 60 %
Platelet Count: 306 10*3/uL (ref 150–400)
RBC: 4.64 MIL/uL (ref 3.87–5.11)
RDW: 12.8 % (ref 11.5–15.5)
WBC Count: 5.5 10*3/uL (ref 4.0–10.5)
nRBC: 0 % (ref 0.0–0.2)

## 2022-05-04 LAB — CMP (CANCER CENTER ONLY)
ALT: 18 U/L (ref 0–44)
AST: 21 U/L (ref 15–41)
Albumin: 3.9 g/dL (ref 3.5–5.0)
Alkaline Phosphatase: 57 U/L (ref 38–126)
Anion gap: 5 (ref 5–15)
BUN: 23 mg/dL (ref 8–23)
CO2: 30 mmol/L (ref 22–32)
Calcium: 10 mg/dL (ref 8.9–10.3)
Chloride: 104 mmol/L (ref 98–111)
Creatinine: 1.14 mg/dL — ABNORMAL HIGH (ref 0.44–1.00)
GFR, Estimated: 51 mL/min — ABNORMAL LOW (ref >60)
Glucose, Bld: 98 mg/dL (ref 70–99)
Potassium: 3.5 mmol/L (ref 3.5–5.1)
Sodium: 139 mmol/L (ref 135–145)
Total Bilirubin: 0.4 mg/dL (ref 0.3–1.2)
Total Protein: 7.8 g/dL (ref 6.5–8.1)

## 2022-05-04 LAB — VITAMIN B12: Vitamin B-12: 233 pg/mL (ref 180–914)

## 2022-05-04 NOTE — Assessment & Plan Note (Addendum)
04/02/2021:Left lumpectomy: Grade 2 IDC, 1.1 cm, with intermediate grade DCIS, margins negative, 1/4 lymph nodes positive with extracapsular extension, ER 70%, PR 40%, HER2 equal vocal negative FISH, Ki-67 15%  MammaPrint: Low risk luminal type a  Adjuvant radiation: 05/07/2021-06/09/2021   Treatment plan: Adjuvant antiestrogen therapy (initially letrozole then tamoxifen and now anastrozole) Anastrozole toxicities: 1.  Hot flashes which wake her up at night: I instructed her to take the medication at bedtime and see if it makes a difference.  If it does not make a difference then we would treat her with [place]. She will call us in a couple of weeks to see if she needs that new medication.  Breast cancer surveillance: 1.  Breast exam 05/04/2022: Benign 2. mammogram 03/08/2022: Benign breast density category B  Return to clinic in 1 year for follow-up

## 2022-05-06 ENCOUNTER — Telehealth: Payer: Self-pay

## 2022-05-06 NOTE — Telephone Encounter (Signed)
-----   Message from Gardenia Phlegm, NP sent at 05/04/2022  4:28 PM EDT ----- B12 slightly low, please let patient know, I would recommend oral b12 if she isn't taking it sublingual and lets add on intrinsic factor and antiparietal antibodies.  Thanks ----- Message ----- From: Buel Ream, Lab In [place] Sent: 05/04/2022  11:13 AM EDT To: Gardenia Phlegm, NP

## 2022-05-06 NOTE — Telephone Encounter (Signed)
Attempted to call pt to give results and recommendation per NP. LVM for pt to call back.

## 2022-05-06 NOTE — Telephone Encounter (Signed)
Spoke with pt, she was in agreement with sublingual b12. I also called lab and asked if we could add on intrinsic factor and antiparietal antibodies. Per Rosann Auerbach, unable to add as both tests require their own tubes.

## 2022-05-17 ENCOUNTER — Ambulatory Visit: Payer: Medicare Other | Attending: General Surgery

## 2022-05-17 VITALS — Wt 272.1 lb

## 2022-05-17 DIAGNOSIS — Z483 Aftercare following surgery for neoplasm: Secondary | ICD-10-CM | POA: Insufficient documentation

## 2022-05-17 NOTE — Therapy (Signed)
OUTPATIENT PHYSICAL THERAPY SOZO SCREENING NOTE   Patient Name: Christy Strickland MRN: [id number] DOB:May 18, 1951, 71 y.o., female Today's Date: 05/17/2022  PCP: Orpah Melter, MD REFERRING PROVIDER: Jovita Kussmaul, MD   PT End of Session - 05/17/22 [phone]     Visit Number 2   # unchanged due to screen only   PT Start Time 1941    PT Stop Time 0953    PT Time Calculation (min) 5 min    Activity Tolerance Patient tolerated treatment well    Behavior During Therapy WFL for tasks assessed/performed             Past Medical History:  Diagnosis Date   Arthritis    Breast cancer ([place])    left breast IDC   Hypertension    Neuromuscular disorder ([place])    neuropathy in feet   Seasonal allergies    Sleep apnea    uses CPAP nightly   Past Surgical History:  Procedure Laterality Date   BACK SURGERY     lower back   bilarteral knee replacements Bilateral    BREAST LUMPECTOMY WITH RADIOACTIVE SEED AND SENTINEL LYMPH NODE BIOPSY Left 04/02/2021   Procedure: LEFT BREAST LUMPECTOMY WITH RADIOACTIVE SEED AND SENTINEL LYMPH NODE BIOPSY;  Surgeon: Jovita Kussmaul, MD;  Location: [place];  Service: General;  Laterality: Left;  120 MINUTES ROOM 2 IQ   CESAREAN SECTION     x 2   COLON SURGERY     DIAGNOSTIC LAPAROSCOPY     benign mass   DILATATION & CURETTAGE/HYSTEROSCOPY WITH MYOSURE  04/27/2018   Procedure: DILATATION & CURETTAGE/HYSTEROSCOPY WITH MYOSURE;  Surgeon: Janyth Pupa, DO;  Location: [facility];  Service: Gynecology;;   DILATION AND CURETTAGE OF UTERUS     x 2    RADIOACTIVE SEED GUIDED AXILLARY SENTINEL LYMPH NODE Left 04/02/2021   Procedure: RADIOACTIVE SEED GUIDED AXILLARY SENTINEL LYMPH NODE DISSECTION;  Surgeon: Jovita Kussmaul, MD;  Location: [facility];  Service: General;  Laterality: Left;   remove pituitary gland     Patient Active Problem List   Diagnosis Date Noted   Hyperlipidemia 03/04/2022   HTN (hypertension) 03/04/2022    Malignant neoplasm of upper-outer quadrant of left breast in female, estrogen receptor positive ([place]) 03/23/2021   Cervical radiculopathy 09/06/2017   Hirsutism 06/03/2010   Hyperprolactinemia ([place]) 06/03/2010    REFERRING DIAG: left breast cancer at risk for lymphedema  THERAPY DIAG:  Aftercare following surgery for neoplasm  PERTINENT HISTORY: Patient was diagnosed on 03/04/2021 with left grade II invasive ductal carcinoma breast cancer. It measures 1.1 cm and is located in the upper outer quadrant. It is ER/PR positive and HER2 negative with a Ki67 of 15%. She has baseline bilateral feet neuropathy. She has a positive axillary lymph node. Lt lumpectomy with 1/4 lymph nodes positive on 04/02/21. Currently in radiation.   PRECAUTIONS: left UE Lymphedema risk, None  SUBJECTIVE: Pt returns for her 3 month L-Dex screen.   PAIN:  Are you having pain? No  SOZO SCREENING: Patient was assessed today using the SOZO machine to determine the lymphedema index score. This was compared to her baseline score. It was determined that she is within the recommended range when compared to her baseline and no further action is needed at this time. She will continue SOZO screenings. These are done every 3 months for 2 years post operatively followed by every 6 months for 2 years, and then annually.   L-DEX FLOWSHEETS -  05/17/22 0900       L-DEX LYMPHEDEMA SCREENING   Measurement Type Unilateral    L-DEX MEASUREMENT EXTREMITY Upper Extremity    POSITION  Standing    DOMINANT SIDE Right    At Risk Side Left    BASELINE SCORE (UNILATERAL) -0.8    L-DEX SCORE (UNILATERAL) -2.3    VALUE CHANGE (UNILAT) -1.5              Otelia Limes, PTA 05/17/2022, 9:52 AM

## 2022-05-18 DIAGNOSIS — E782 Mixed hyperlipidemia: Secondary | ICD-10-CM | POA: Diagnosis not present

## 2022-05-18 DIAGNOSIS — C50412 Malignant neoplasm of upper-outer quadrant of left female breast: Secondary | ICD-10-CM | POA: Diagnosis not present

## 2022-05-18 DIAGNOSIS — I10 Essential (primary) hypertension: Secondary | ICD-10-CM | POA: Diagnosis not present

## 2022-06-15 ENCOUNTER — Telehealth: Payer: Self-pay | Admitting: Hematology and Oncology

## 2022-06-15 DIAGNOSIS — I10 Essential (primary) hypertension: Secondary | ICD-10-CM | POA: Diagnosis not present

## 2022-06-15 NOTE — Telephone Encounter (Signed)
Scheduled appointment per [place] RN. Left voicemail.

## 2022-07-06 ENCOUNTER — Other Ambulatory Visit: Payer: Self-pay

## 2022-07-06 ENCOUNTER — Inpatient Hospital Stay: Payer: Medicare Other | Attending: Adult Health

## 2022-07-06 DIAGNOSIS — Z170 Estrogen receptor positive status [ER+]: Secondary | ICD-10-CM | POA: Diagnosis not present

## 2022-07-06 DIAGNOSIS — C50412 Malignant neoplasm of upper-outer quadrant of left female breast: Secondary | ICD-10-CM | POA: Diagnosis not present

## 2022-07-06 DIAGNOSIS — I10 Essential (primary) hypertension: Secondary | ICD-10-CM | POA: Diagnosis not present

## 2022-07-06 LAB — BASIC METABOLIC PANEL - CANCER CENTER ONLY
Anion gap: 6 (ref 5–15)
BUN: 21 mg/dL (ref 8–23)
CO2: 29 mmol/L (ref 22–32)
Calcium: 10.8 mg/dL — ABNORMAL HIGH (ref 8.9–10.3)
Chloride: 105 mmol/L (ref 98–111)
Creatinine: 1.22 mg/dL — ABNORMAL HIGH (ref 0.44–1.00)
GFR, Estimated: 47 mL/min — ABNORMAL LOW (ref >60)
Glucose, Bld: 90 mg/dL (ref 70–99)
Potassium: 3.7 mmol/L (ref 3.5–5.1)
Sodium: 140 mmol/L (ref 135–145)

## 2022-08-30 ENCOUNTER — Ambulatory Visit: Payer: Medicare Other | Attending: General Surgery

## 2022-08-30 VITALS — Wt 273.4 lb

## 2022-08-30 DIAGNOSIS — Z483 Aftercare following surgery for neoplasm: Secondary | ICD-10-CM | POA: Insufficient documentation

## 2022-08-30 NOTE — Therapy (Signed)
OUTPATIENT PHYSICAL THERAPY SOZO SCREENING NOTE   Patient Name: Christy Strickland MRN: [id number] DOB:01/13/1951, 72 y.o., female Today's Date: 08/30/2022  PCP: Orpah Melter, MD REFERRING PROVIDER: Jovita Kussmaul, MD   PT End of Session - 08/30/22 1005     Visit Number 2   # unchanged due to screen only   PT Start Time 1002    PT Stop Time 1006    PT Time Calculation (min) 4 min    Activity Tolerance Patient tolerated treatment well    Behavior During Therapy WFL for tasks assessed/performed             Past Medical History:  Diagnosis Date   Arthritis    Breast cancer ([place])    left breast IDC   Hypertension    Neuromuscular disorder ([place])    neuropathy in feet   Seasonal allergies    Sleep apnea    uses CPAP nightly   Past Surgical History:  Procedure Laterality Date   BACK SURGERY     lower back   bilarteral knee replacements Bilateral    BREAST LUMPECTOMY WITH RADIOACTIVE SEED AND SENTINEL LYMPH NODE BIOPSY Left 04/02/2021   Procedure: LEFT BREAST LUMPECTOMY WITH RADIOACTIVE SEED AND SENTINEL LYMPH NODE BIOPSY;  Surgeon: Jovita Kussmaul, MD;  Location: [facility];  Service: General;  Laterality: Left;  120 MINUTES ROOM 2 IQ   CESAREAN SECTION     x 2   COLON SURGERY     DIAGNOSTIC LAPAROSCOPY     benign mass   DILATATION & CURETTAGE/HYSTEROSCOPY WITH MYOSURE  04/27/2018   Procedure: DILATATION & CURETTAGE/HYSTEROSCOPY WITH MYOSURE;  Surgeon: Janyth Pupa, DO;  Location: [facility];  Service: Gynecology;;   DILATION AND CURETTAGE OF UTERUS     x 2    RADIOACTIVE SEED GUIDED AXILLARY SENTINEL LYMPH NODE Left 04/02/2021   Procedure: RADIOACTIVE SEED GUIDED AXILLARY SENTINEL LYMPH NODE DISSECTION;  Surgeon: Jovita Kussmaul, MD;  Location: [facility];  Service: General;  Laterality: Left;   remove pituitary gland     Patient Active Problem List   Diagnosis Date Noted   Hyperlipidemia 03/04/2022   HTN (hypertension) 03/04/2022    Malignant neoplasm of upper-outer quadrant of left breast in female, estrogen receptor positive ([place]) 03/23/2021   Cervical radiculopathy 09/06/2017   Hirsutism 06/03/2010   Hyperprolactinemia ([place]) 06/03/2010    REFERRING DIAG: left breast cancer at risk for lymphedema  THERAPY DIAG: Aftercare following surgery for neoplasm  PERTINENT HISTORY: Patient was diagnosed on 03/04/2021 with left grade II invasive ductal carcinoma breast cancer. It measures 1.1 cm and is located in the upper outer quadrant. It is ER/PR positive and HER2 negative with a Ki67 of 15%. She has baseline bilateral feet neuropathy. She has a positive axillary lymph node. Lt lumpectomy with 1/4 lymph nodes positive on 04/02/21. Currently in radiation.   PRECAUTIONS: left UE Lymphedema risk, None  SUBJECTIVE: Pt returns for her 3 month L-Dex screen.   PAIN:  Are you having pain? No  SOZO SCREENING: Patient was assessed today using the SOZO machine to determine the lymphedema index score. This was compared to her baseline score. It was determined that she is within the recommended range when compared to her baseline and no further action is needed at this time. She will continue SOZO screenings. These are done every 3 months for 2 years post operatively followed by every 6 months for 2 years, and then annually.   L-DEX FLOWSHEETS -  08/30/22 1000       L-DEX LYMPHEDEMA SCREENING   Measurement Type Unilateral    L-DEX MEASUREMENT EXTREMITY Upper Extremity    POSITION  Standing    DOMINANT SIDE Right    At Risk Side Left    BASELINE SCORE (UNILATERAL) -0.8    L-DEX SCORE (UNILATERAL) -2.2    VALUE CHANGE (UNILAT) -1.4              Otelia Limes, PTA 08/30/2022, 10:07 AM

## 2022-11-09 DIAGNOSIS — R300 Dysuria: Secondary | ICD-10-CM | POA: Diagnosis not present

## 2022-11-23 DIAGNOSIS — Z0000 Encounter for general adult medical examination without abnormal findings: Secondary | ICD-10-CM | POA: Diagnosis not present

## 2022-11-23 DIAGNOSIS — I10 Essential (primary) hypertension: Secondary | ICD-10-CM | POA: Diagnosis not present

## 2022-11-23 DIAGNOSIS — Z1211 Encounter for screening for malignant neoplasm of colon: Secondary | ICD-10-CM | POA: Diagnosis not present

## 2022-11-23 DIAGNOSIS — E7800 Pure hypercholesterolemia, unspecified: Secondary | ICD-10-CM | POA: Diagnosis not present

## 2022-11-23 DIAGNOSIS — C50412 Malignant neoplasm of upper-outer quadrant of left female breast: Secondary | ICD-10-CM | POA: Diagnosis not present

## 2022-11-29 ENCOUNTER — Ambulatory Visit: Payer: Medicare Other | Attending: General Surgery

## 2022-11-29 VITALS — Wt 272.4 lb

## 2022-11-29 DIAGNOSIS — Z483 Aftercare following surgery for neoplasm: Secondary | ICD-10-CM

## 2022-11-29 NOTE — Therapy (Signed)
OUTPATIENT PHYSICAL THERAPY SOZO SCREENING NOTE   Patient Name: Christy Strickland MRN: [id number] DOB:1950/10/28, 72 y.o., female Today's Date: 11/29/2022  PCP: Joycelyn Rua, MD REFERRING PROVIDER: Griselda Miner, MD   PT End of Session - 11/29/22 1014     Visit Number 2   # ucnhanged due to screen only   PT Start Time 1012    PT Stop Time 1016    PT Time Calculation (min) 4 min    Activity Tolerance Patient tolerated treatment well    Behavior During Therapy WFL for tasks assessed/performed             Past Medical History:  Diagnosis Date   Arthritis    Breast cancer ([facility])    left breast IDC   Hypertension    Neuromuscular disorder ([facility])    neuropathy in feet   Seasonal allergies    Sleep apnea    uses CPAP nightly   Past Surgical History:  Procedure Laterality Date   BACK SURGERY     lower back   bilarteral knee replacements Bilateral    BREAST LUMPECTOMY WITH RADIOACTIVE SEED AND SENTINEL LYMPH NODE BIOPSY Left 04/02/2021   Procedure: LEFT BREAST LUMPECTOMY WITH RADIOACTIVE SEED AND SENTINEL LYMPH NODE BIOPSY;  Surgeon: Griselda Miner, MD;  Location: [facility];  Service: General;  Laterality: Left;  120 MINUTES ROOM 2 IQ   CESAREAN SECTION     x 2   COLON SURGERY     DIAGNOSTIC LAPAROSCOPY     benign mass   DILATATION & CURETTAGE/HYSTEROSCOPY WITH MYOSURE  04/27/2018   Procedure: DILATATION & CURETTAGE/HYSTEROSCOPY WITH MYOSURE;  Surgeon: Myna Hidalgo, DO;  Location: [facility];  Service: Gynecology;;   DILATION AND CURETTAGE OF UTERUS     x 2    RADIOACTIVE SEED GUIDED AXILLARY SENTINEL LYMPH NODE Left 04/02/2021   Procedure: RADIOACTIVE SEED GUIDED AXILLARY SENTINEL LYMPH NODE DISSECTION;  Surgeon: Griselda Miner, MD;  Location: [facility];  Service: General;  Laterality: Left;   remove pituitary gland     Patient Active Problem List   Diagnosis Date Noted   Hyperlipidemia 03/04/2022   HTN (hypertension) 03/04/2022    Malignant neoplasm of upper-outer quadrant of left breast in female, estrogen receptor positive 03/23/2021   Cervical radiculopathy 09/06/2017   Hirsutism 06/03/2010   Hyperprolactinemia 06/03/2010    REFERRING DIAG: left breast cancer at risk for lymphedema  THERAPY DIAG: Aftercare following surgery for neoplasm  PERTINENT HISTORY: Patient was diagnosed on 03/04/2021 with left grade II invasive ductal carcinoma breast cancer. It measures 1.1 cm and is located in the upper outer quadrant. It is ER/PR positive and HER2 negative with a Ki67 of 15%. She has baseline bilateral feet neuropathy. She has a positive axillary lymph node. Lt lumpectomy with 1/4 lymph nodes positive on 04/02/21. Currently in radiation.   PRECAUTIONS: left UE Lymphedema risk, None  SUBJECTIVE: Pt returns for her 3 month L-Dex screen.   PAIN:  Are you having pain? No  SOZO SCREENING: Patient was assessed today using the SOZO machine to determine the lymphedema index score. This was compared to her baseline score. It was determined that she is within the recommended range when compared to her baseline and no further action is needed at this time. She will continue SOZO screenings. These are done every 3 months for 2 years post operatively followed by every 6 months for 2 years, and then annually.   L-DEX FLOWSHEETS - 11/29/22 1000  L-DEX LYMPHEDEMA SCREENING   Measurement Type Unilateral    L-DEX MEASUREMENT EXTREMITY Upper Extremity    POSITION  Standing    DOMINANT SIDE Right    At Risk Side Left    BASELINE SCORE (UNILATERAL) -0.8    L-DEX SCORE (UNILATERAL) -4    VALUE CHANGE (UNILAT) -3.2              Hermenia Bers, PTA 11/29/2022, 10:15 AM

## 2022-12-09 DIAGNOSIS — Z1211 Encounter for screening for malignant neoplasm of colon: Secondary | ICD-10-CM | POA: Diagnosis not present

## 2022-12-23 ENCOUNTER — Encounter: Payer: Self-pay | Admitting: Hematology and Oncology

## 2022-12-23 ENCOUNTER — Other Ambulatory Visit: Payer: Self-pay

## 2022-12-23 DIAGNOSIS — C50412 Malignant neoplasm of upper-outer quadrant of left female breast: Secondary | ICD-10-CM

## 2023-01-04 DIAGNOSIS — J3089 Other allergic rhinitis: Secondary | ICD-10-CM | POA: Diagnosis not present

## 2023-01-17 ENCOUNTER — Other Ambulatory Visit: Payer: Self-pay | Admitting: Adult Health

## 2023-01-17 DIAGNOSIS — C50412 Malignant neoplasm of upper-outer quadrant of left female breast: Secondary | ICD-10-CM

## 2023-02-03 DIAGNOSIS — C44319 Basal cell carcinoma of skin of other parts of face: Secondary | ICD-10-CM | POA: Diagnosis not present

## 2023-02-03 DIAGNOSIS — C44311 Basal cell carcinoma of skin of nose: Secondary | ICD-10-CM | POA: Diagnosis not present

## 2023-02-03 DIAGNOSIS — L821 Other seborrheic keratosis: Secondary | ICD-10-CM | POA: Diagnosis not present

## 2023-02-28 ENCOUNTER — Ambulatory Visit: Payer: Medicare Other | Attending: General Surgery | Admitting: Rehabilitation

## 2023-02-28 ENCOUNTER — Encounter: Payer: Self-pay | Admitting: Rehabilitation

## 2023-02-28 DIAGNOSIS — C50412 Malignant neoplasm of upper-outer quadrant of left female breast: Secondary | ICD-10-CM | POA: Insufficient documentation

## 2023-02-28 DIAGNOSIS — Z483 Aftercare following surgery for neoplasm: Secondary | ICD-10-CM | POA: Insufficient documentation

## 2023-02-28 DIAGNOSIS — Z170 Estrogen receptor positive status [ER+]: Secondary | ICD-10-CM | POA: Insufficient documentation

## 2023-02-28 NOTE — Therapy (Signed)
OUTPATIENT PHYSICAL THERAPY SOZO SCREENING NOTE   Patient Name: Christy Strickland MRN: [id number] DOB:Dec 05, 1950, 72 y.o., female Today's Date: 02/28/2023  PCP: Joycelyn Rua, MD REFERRING PROVIDER: Griselda Miner, MD   PT End of Session - 02/28/23 1029     Visit Number 2   screen   PT Start Time 1025    PT Stop Time 1029    PT Time Calculation (min) 4 min    Activity Tolerance Patient tolerated treatment well    Behavior During Therapy WFL for tasks assessed/performed             Past Medical History:  Diagnosis Date   Arthritis    Breast cancer ([facility])    left breast IDC   Hypertension    Neuromuscular disorder ([facility])    neuropathy in feet   Seasonal allergies    Sleep apnea    uses CPAP nightly   Past Surgical History:  Procedure Laterality Date   BACK SURGERY     lower back   bilarteral knee replacements Bilateral    BREAST LUMPECTOMY WITH RADIOACTIVE SEED AND SENTINEL LYMPH NODE BIOPSY Left 04/02/2021   Procedure: LEFT BREAST LUMPECTOMY WITH RADIOACTIVE SEED AND SENTINEL LYMPH NODE BIOPSY;  Surgeon: Griselda Miner, MD;  Location: [facility];  Service: General;  Laterality: Left;  120 MINUTES ROOM 2 IQ   CESAREAN SECTION     x 2   COLON SURGERY     DIAGNOSTIC LAPAROSCOPY     benign mass   DILATATION & CURETTAGE/HYSTEROSCOPY WITH MYOSURE  04/27/2018   Procedure: DILATATION & CURETTAGE/HYSTEROSCOPY WITH MYOSURE;  Surgeon: Myna Hidalgo, DO;  Location: [facility];  Service: Gynecology;;   DILATION AND CURETTAGE OF UTERUS     x 2    RADIOACTIVE SEED GUIDED AXILLARY SENTINEL LYMPH NODE Left 04/02/2021   Procedure: RADIOACTIVE SEED GUIDED AXILLARY SENTINEL LYMPH NODE DISSECTION;  Surgeon: Griselda Miner, MD;  Location: [facility];  Service: General;  Laterality: Left;   remove pituitary gland     Patient Active Problem List   Diagnosis Date Noted   Hyperlipidemia 03/04/2022   HTN (hypertension) 03/04/2022   Malignant neoplasm of  upper-outer quadrant of left breast in female, estrogen receptor positive ([facility]) 03/23/2021   Cervical radiculopathy 09/06/2017   Hirsutism 06/03/2010   Hyperprolactinemia ([facility]) 06/03/2010    REFERRING DIAG: left breast cancer at risk for lymphedema  THERAPY DIAG: Aftercare following surgery for neoplasm  Malignant neoplasm of upper-outer quadrant of left breast in female, estrogen receptor positive ([facility])  PERTINENT HISTORY: Patient was diagnosed on 03/04/2021 with left grade II invasive ductal carcinoma breast cancer. It measures 1.1 cm and is located in the upper outer quadrant. It is ER/PR positive and HER2 negative with a Ki67 of 15%. She has baseline bilateral feet neuropathy. She has a positive axillary lymph node. Lt lumpectomy with 1/4 lymph nodes positive on 04/02/21. Currently in radiation.   PRECAUTIONS: left UE Lymphedema risk, None  SUBJECTIVE: Pt returns for her 3 month L-Dex screen.   PAIN:  Are you having pain? No  SOZO SCREENING: Patient was assessed today using the SOZO machine to determine the lymphedema index score. This was compared to her baseline score. It was determined that she is within the recommended range when compared to her baseline and no further action is needed at this time. She will continue SOZO screenings. These are done every 3 months for 2 years post operatively followed by every 6 months for  2 years, and then annually.   L-DEX FLOWSHEETS - 02/28/23 1000       L-DEX LYMPHEDEMA SCREENING   Measurement Type Unilateral    L-DEX MEASUREMENT EXTREMITY Upper Extremity    POSITION  Standing    DOMINANT SIDE Right    At Risk Side Left    BASELINE SCORE (UNILATERAL) -0.8    L-DEX SCORE (UNILATERAL) -1.1    VALUE CHANGE (UNILAT) -0.3              Jamarques Pinedo R, PT 02/28/2023, 10:29 AM

## 2023-03-10 ENCOUNTER — Ambulatory Visit
Admission: RE | Admit: 2023-03-10 | Discharge: 2023-03-10 | Disposition: A | Discharge status: Home / Self Care | Payer: Medicare Other | Source: Ambulatory Visit | Attending: Hematology and Oncology | Admitting: Hematology and Oncology

## 2023-03-10 DIAGNOSIS — Z9889 Other specified postprocedural states: Secondary | ICD-10-CM | POA: Diagnosis not present

## 2023-03-10 DIAGNOSIS — C50412 Malignant neoplasm of upper-outer quadrant of left female breast: Secondary | ICD-10-CM | POA: Diagnosis not present

## 2023-03-10 DIAGNOSIS — Z170 Estrogen receptor positive status [ER+]: Secondary | ICD-10-CM

## 2023-05-05 NOTE — Assessment & Plan Note (Addendum)
04/02/2021:Left lumpectomy: Grade 2 IDC, 1.1 cm, with intermediate grade DCIS, margins negative, 1/4 lymph nodes positive with extracapsular extension, ER 70%, PR 40%, HER2 equal vocal negative FISH, Ki-67 15%   MammaPrint: Low risk luminal type a  Adjuvant radiation: 05/07/2021-06/09/2021    Treatment plan: Adjuvant antiestrogen therapy (initially letrozole then tamoxifen and now anastrozole) Anastrozole toxicities: 1.  Hot flashes: Patient could not tolerate tamoxifen, letrozole and exemestane is a very expensive.  Therefore she will take anastrozole 4 days a week.   Breast cancer surveillance: 1.  mammogram 03/10/2023: Benign breast density category B   Return to clinic in 1 year for follow-up

## 2023-05-09 ENCOUNTER — Inpatient Hospital Stay: Payer: Medicare Other | Attending: Hematology and Oncology | Admitting: Hematology and Oncology

## 2023-05-09 VITALS — BP 183/66 | HR 68 | Temp 97.5°F | Resp 18 | Ht 67.0 in | Wt 278.5 lb

## 2023-05-09 DIAGNOSIS — Z170 Estrogen receptor positive status [ER+]: Secondary | ICD-10-CM | POA: Diagnosis not present

## 2023-05-09 DIAGNOSIS — C50412 Malignant neoplasm of upper-outer quadrant of left female breast: Secondary | ICD-10-CM | POA: Diagnosis not present

## 2023-05-09 DIAGNOSIS — Z780 Asymptomatic menopausal state: Secondary | ICD-10-CM | POA: Diagnosis not present

## 2023-05-09 DIAGNOSIS — Z79811 Long term (current) use of aromatase inhibitors: Secondary | ICD-10-CM | POA: Diagnosis not present

## 2023-05-09 DIAGNOSIS — Z888 Allergy status to other drugs, medicaments and biological substances status: Secondary | ICD-10-CM | POA: Insufficient documentation

## 2023-05-09 DIAGNOSIS — R232 Flushing: Secondary | ICD-10-CM | POA: Diagnosis not present

## 2023-05-09 DIAGNOSIS — Z880 Allergy status to penicillin: Secondary | ICD-10-CM | POA: Insufficient documentation

## 2023-05-09 DIAGNOSIS — Z79899 Other long term (current) drug therapy: Secondary | ICD-10-CM | POA: Diagnosis not present

## 2023-05-09 MED ORDER — ANASTROZOLE 1 MG PO TABS: 1.0000 mg | ORAL_TABLET | Freq: Every day | ORAL | Status: DC

## 2023-05-09 NOTE — Progress Notes (Signed)
Patient Care Team: Joycelyn Rua, MD as PCP - General (Family Medicine) Griselda Miner, MD as Consulting Physician (General Surgery) Serena Croissant, MD as Consulting Physician (Hematology and Oncology) Lonie Peak, MD as Attending Physician (Radiation Oncology)  DIAGNOSIS:  Encounter Diagnoses  Name Primary?   Malignant neoplasm of upper-outer quadrant of left breast in female, estrogen receptor positive (HCC) Yes   Malignant neoplasm of upper-outer quadrant of left female breast (HCC)    Post-menopausal     SUMMARY OF ONCOLOGIC HISTORY: Oncology History  Malignant neoplasm of upper-outer quadrant of left breast in female, estrogen receptor positive (HCC)  03/23/2021 Initial Diagnosis   Screening mammogram: a possible mass and abnormal lymph node in the left breast. Diagnostic mammogram and [place]: suspicious 1.1 cm mass at the left breast 2 o'clock position and enlarged left axillary lymph node. 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  ER 70%, PR 40%, Ki-67 15%, HER2 negative by [facility]   03/25/2021 Cancer Staging   Staging form: Breast, AJCC 8th Edition - Clinical stage from 03/25/2021: Stage IB (cT1c, cN1, cM0, G2, ER+, PR+, HER2-) - Signed by Serena Croissant, MD on 03/25/2021 Stage prefix: Initial diagnosis Histologic grading system: 3 grade system   04/02/2021 Surgery   Left lumpectomy: Grade 2 IDC, 1.1 cm, with intermediate grade DCIS, margins negative, 1/4 lymph nodes positive with extracapsular extension, ER 70%, PR 40%, HER2 equal vocal negative FISH, Ki-67 15%   04/02/2021 Miscellaneous   Mammaprint: Low risk   05/07/2021 - 06/09/2021 Radiation Therapy   Adjuvant radiation   06/24/2021 -  Anti-estrogen oral therapy   Adjuvant antiestrogen therapy with letrozole 2.5 mg daily     CHIEF COMPLIANT: Follow-up on anastrozole therapy  Christy Strickland is a 72 year old with above-mentioned history of breast cancer has been on  antiestrogen therapy.  Initially with tamoxifen then switched to letrozole and now is on anastrozole.  She reports that the hot flashes continue to bother her significantly.   ALLERGIES:  is allergic to mushroom extract complex, amoxicillin-pot clavulanate, amlodipine, and meperidine hcl.  MEDICATIONS:  Current Outpatient Medications  Medication Sig Dispense Refill   cetirizine (ZYRTEC) 10 MG tablet Take 10 mg by mouth daily.     Cholecalciferol 50 MCG (2000 UT) TABS 1 tablet Orally Once a day     Cyanocobalamin (VITAMIN B12) 1000 MCG TBCR 1 tablet Orally Once a day     hydrochlorothiazide (HYDRODIURIL) 25 MG tablet Take 25 mg by mouth daily.     losartan (COZAAR) 25 MG tablet Take 25 mg by mouth daily.     Multiple Vitamin (MULTIVITAMIN WITH MINERALS) TABS tablet Take 1 tablet by mouth daily.     naproxen (NAPROSYN) 500 MG tablet Take 500 mg by mouth as needed.     vitamin C (ASCORBIC ACID) 250 MG tablet Take 250 mg by mouth daily.     anastrozole (ARIMIDEX) 1 MG tablet Take 1 tablet (1 mg total) by mouth daily.     No current facility-administered medications for this visit.    PHYSICAL EXAMINATION: ECOG PERFORMANCE STATUS: 1 - Symptomatic but completely ambulatory  Vitals:   05/09/23 1102  BP: (!) 183/66  Pulse: 68  Resp: 18  Temp: (!) 97.5 F (36.4 C)  SpO2: (!) 68%   Filed Weights   05/09/23 1102  Weight: 278 lb 8 oz (126.3 kg)      LABORATORY DATA:  I have reviewed the data as listed    Latest  Ref Rng & Units 07/06/2022   11:02 AM 05/04/2022   11:02 AM 12/30/2021   11:02 AM  CMP  Glucose 70 - 99 mg/dL 90  98  89   BUN 8 - 23 mg/dL 21  23  23    Creatinine 0.44 - 1.00 mg/dL 4.09  8.11  9.14   Sodium 135 - 145 mmol/L 140  139  140   Potassium 3.5 - 5.1 mmol/L 3.7  3.5  3.9   Chloride 98 - 111 mmol/L 105  104  103   CO2 22 - 32 mmol/L 29  30  32   Calcium 8.9 - 10.3 mg/dL 78.2  95.6  21.3   Total Protein 6.5 - 8.1 g/dL  7.8  7.9   Total Bilirubin 0.3 - 1.2  mg/dL  0.4  0.4   Alkaline Phos 38 - 126 U/L  57  71   AST 15 - 41 U/L  21  21   ALT 0 - 44 U/L  18  20     Lab Results  Component Value Date   WBC 5.5 05/04/2022   HGB 14.5 05/04/2022   HCT 41.5 05/04/2022   MCV 89.4 05/04/2022   PLT 306 05/04/2022   NEUTROABS 3.3 05/04/2022    ASSESSMENT & PLAN:  Malignant neoplasm of upper-outer quadrant of left breast in female, estrogen receptor positive (HCC) 04/02/2021:Left lumpectomy: Grade 2 IDC, 1.1 cm, with intermediate grade DCIS, margins negative, 1/4 lymph nodes positive with extracapsular extension, ER 70%, PR 40%, HER2 equal vocal negative FISH, Ki-67 15%   MammaPrint: Low risk luminal type a  Adjuvant radiation: 05/07/2021-06/09/2021    Treatment plan: Adjuvant antiestrogen therapy (initially letrozole then tamoxifen and now anastrozole) Anastrozole toxicities: 1.  Hot flashes: Patient could not tolerate tamoxifen, letrozole and exemestane is a very expensive.  Therefore she will take anastrozole 4 days a week.   Breast cancer surveillance: 1.  mammogram 03/10/2023: Benign breast density category B   Return to clinic in 1 year for follow-up   Orders Placed This Encounter  Procedures   DG Bone Density    Standing Status:   Future    Standing Expiration Date:   05/08/2024    Order Specific Question:   Reason for Exam (SYMPTOM  OR DIAGNOSIS REQUIRED)    Answer:   Post menopausal    Order Specific Question:   Preferred imaging location?    Answer:   [facility]    Order Specific Question:   Release to patient    Answer:   Immediate   The patient has a good understanding of the overall plan. she agrees with it. she will call with any problems that may develop before the next visit here. Total time spent: 30 mins including face to face time and time spent for planning, charting and co-ordination of care   Tamsen Meek, MD 05/09/23

## 2023-05-25 DIAGNOSIS — I10 Essential (primary) hypertension: Secondary | ICD-10-CM | POA: Diagnosis not present

## 2023-05-25 DIAGNOSIS — N1831 Chronic kidney disease, stage 3a: Secondary | ICD-10-CM | POA: Diagnosis not present

## 2023-05-25 DIAGNOSIS — C50412 Malignant neoplasm of upper-outer quadrant of left female breast: Secondary | ICD-10-CM | POA: Diagnosis not present

## 2023-05-25 DIAGNOSIS — E7800 Pure hypercholesterolemia, unspecified: Secondary | ICD-10-CM | POA: Diagnosis not present

## 2023-05-25 DIAGNOSIS — M130 Polyarthritis, unspecified: Secondary | ICD-10-CM | POA: Diagnosis not present

## 2023-05-25 DIAGNOSIS — G629 Polyneuropathy, unspecified: Secondary | ICD-10-CM | POA: Diagnosis not present

## 2023-06-06 ENCOUNTER — Ambulatory Visit: Payer: Medicare Other | Attending: General Surgery

## 2023-06-06 VITALS — Wt 279.4 lb

## 2023-06-06 DIAGNOSIS — Z483 Aftercare following surgery for neoplasm: Secondary | ICD-10-CM | POA: Insufficient documentation

## 2023-06-06 NOTE — Therapy (Signed)
OUTPATIENT PHYSICAL THERAPY SOZO SCREENING NOTE   Patient Name: Christy Strickland MRN: [id number] DOB:06-Feb-1951, 72 y.o., female Today's Date: 06/06/2023  PCP: Joycelyn Rua, MD REFERRING PROVIDER: Griselda Miner, MD   PT End of Session - 06/06/23 1035     Visit Number 2   # unchanged due to screen only   PT Start Time 1033    PT Stop Time 1037    PT Time Calculation (min) 4 min    Activity Tolerance Patient tolerated treatment well    Behavior During Therapy WFL for tasks assessed/performed             Past Medical History:  Diagnosis Date   Arthritis    Breast cancer (HCC)    left breast IDC   Hypertension    Neuromuscular disorder (HCC)    neuropathy in feet   Seasonal allergies    Sleep apnea    uses CPAP nightly   Past Surgical History:  Procedure Laterality Date   BACK SURGERY     lower back   bilarteral knee replacements Bilateral    BREAST LUMPECTOMY WITH RADIOACTIVE SEED AND SENTINEL LYMPH NODE BIOPSY Left 04/02/2021   Procedure: LEFT BREAST LUMPECTOMY WITH RADIOACTIVE SEED AND SENTINEL LYMPH NODE BIOPSY;  Surgeon: Griselda Miner, MD;  Location: [facility];  Service: General;  Laterality: Left;  120 MINUTES ROOM 2 IQ   CESAREAN SECTION     x 2   COLON SURGERY     DIAGNOSTIC LAPAROSCOPY     benign mass   DILATATION & CURETTAGE/HYSTEROSCOPY WITH MYOSURE  04/27/2018   Procedure: DILATATION & CURETTAGE/HYSTEROSCOPY WITH MYOSURE;  Surgeon: Myna Hidalgo, DO;  Location: [facility];  Service: Gynecology;;   DILATION AND CURETTAGE OF UTERUS     x 2    RADIOACTIVE SEED GUIDED AXILLARY SENTINEL LYMPH NODE Left 04/02/2021   Procedure: RADIOACTIVE SEED GUIDED AXILLARY SENTINEL LYMPH NODE DISSECTION;  Surgeon: Griselda Miner, MD;  Location: [facility];  Service: General;  Laterality: Left;   remove pituitary gland     Patient Active Problem List   Diagnosis Date Noted   Hyperlipidemia 03/04/2022   HTN (hypertension) 03/04/2022    Malignant neoplasm of upper-outer quadrant of left breast in female, estrogen receptor positive (HCC) 03/23/2021   Cervical radiculopathy 09/06/2017   Hirsutism 06/03/2010   Hyperprolactinemia (HCC) 06/03/2010    REFERRING DIAG: left breast cancer at risk for lymphedema  THERAPY DIAG: Aftercare following surgery for neoplasm  PERTINENT HISTORY: Patient was diagnosed on 03/04/2021 with left grade II invasive ductal carcinoma breast cancer. It measures 1.1 cm and is located in the upper outer quadrant. It is ER/PR positive and HER2 negative with a Ki67 of 15%. She has baseline bilateral feet neuropathy. She has a positive axillary lymph node. Lt lumpectomy with 1/4 lymph nodes positive on 04/02/21. Currently in radiation.   PRECAUTIONS: left UE Lymphedema risk, None  SUBJECTIVE: Pt returns for her last 3 month L-Dex screen.   PAIN:  Are you having pain? No  SOZO SCREENING: Patient was assessed today using the SOZO machine to determine the lymphedema index score. This was compared to her baseline score. It was determined that she is within the recommended range when compared to her baseline and no further action is needed at this time. She will continue SOZO screenings. These are done every 3 months for 2 years post operatively followed by every 6 months for 2 years, and then annually.   L-DEX FLOWSHEETS -  06/06/23 1000       L-DEX LYMPHEDEMA SCREENING   Measurement Type Unilateral    L-DEX MEASUREMENT EXTREMITY Upper Extremity    POSITION  Standing    DOMINANT SIDE Right    At Risk Side Left    BASELINE SCORE (UNILATERAL) -0.8    L-DEX SCORE (UNILATERAL) -2.6    VALUE CHANGE (UNILAT) -1.8            P: Begin 6 month L-Dex screens.   Hermenia Bers, PTA 06/06/2023, 10:36 AM

## 2023-07-25 ENCOUNTER — Ambulatory Visit (HOSPITAL_BASED_OUTPATIENT_CLINIC_OR_DEPARTMENT_OTHER)
Admission: RE | Admit: 2023-07-25 | Discharge: 2023-07-25 | Disposition: A | Discharge status: Home / Self Care | Payer: Medicare Other | Source: Ambulatory Visit | Attending: Hematology and Oncology | Admitting: Hematology and Oncology

## 2023-07-25 DIAGNOSIS — Z780 Asymptomatic menopausal state: Secondary | ICD-10-CM | POA: Diagnosis not present

## 2023-08-11 ENCOUNTER — Other Ambulatory Visit: Payer: Self-pay | Admitting: *Deleted

## 2023-08-11 ENCOUNTER — Encounter: Payer: Self-pay | Admitting: Hematology and Oncology

## 2023-08-11 DIAGNOSIS — C50412 Malignant neoplasm of upper-outer quadrant of left female breast: Secondary | ICD-10-CM

## 2023-08-11 MED ORDER — ANASTROZOLE 1 MG PO TABS: 1.0000 mg | ORAL_TABLET | Freq: Every day | ORAL | 3 refills | Status: AC

## 2023-12-01 DIAGNOSIS — Z0000 Encounter for general adult medical examination without abnormal findings: Secondary | ICD-10-CM | POA: Diagnosis not present

## 2023-12-05 ENCOUNTER — Ambulatory Visit: Payer: Medicare Other | Attending: General Surgery

## 2023-12-05 VITALS — Wt 282.0 lb

## 2023-12-05 DIAGNOSIS — Z483 Aftercare following surgery for neoplasm: Secondary | ICD-10-CM | POA: Insufficient documentation

## 2023-12-05 NOTE — Therapy (Signed)
 OUTPATIENT PHYSICAL THERAPY SOZO SCREENING NOTE   Patient Name: Christy Strickland MRN: [id number] DOB:Feb 07, 1951, 73 y.o., female Today's Date: 12/05/2023  PCP: Wyn Heater, MD REFERRING PROVIDER: Caralyn Chandler, MD   PT End of Session - 12/05/23 1037     Visit Number 2   # unchnaged due to screen only   PT Start Time 1034    PT Stop Time 1038    PT Time Calculation (min) 4 min    Activity Tolerance Patient tolerated treatment well    Behavior During Therapy WFL for tasks assessed/performed             Past Medical History:  Diagnosis Date   Arthritis    Breast cancer (HCC)    left breast IDC   Hypertension    Neuromuscular disorder (HCC)    neuropathy in feet   Seasonal allergies    Sleep apnea    uses CPAP nightly   Past Surgical History:  Procedure Laterality Date   BACK SURGERY     lower back   bilarteral knee replacements Bilateral    BREAST LUMPECTOMY WITH RADIOACTIVE SEED AND SENTINEL LYMPH NODE BIOPSY Left 04/02/2021   Procedure: LEFT BREAST LUMPECTOMY WITH RADIOACTIVE SEED AND SENTINEL LYMPH NODE BIOPSY;  Surgeon: Caralyn Chandler, MD;  Location: [facility];  Service: General;  Laterality: Left;  120 MINUTES ROOM 2 IQ   CESAREAN SECTION     x 2   COLON SURGERY     DIAGNOSTIC LAPAROSCOPY     benign mass   DILATATION & CURETTAGE/HYSTEROSCOPY WITH MYOSURE  04/27/2018   Procedure: DILATATION & CURETTAGE/HYSTEROSCOPY WITH MYOSURE;  Surgeon: Keene Pastures, DO;  Location: [facility];  Service: Gynecology;;   DILATION AND CURETTAGE OF UTERUS     x 2    RADIOACTIVE SEED GUIDED AXILLARY SENTINEL LYMPH NODE Left 04/02/2021   Procedure: RADIOACTIVE SEED GUIDED AXILLARY SENTINEL LYMPH NODE DISSECTION;  Surgeon: Caralyn Chandler, MD;  Location: [facility];  Service: General;  Laterality: Left;   remove pituitary gland     Patient Active Problem List   Diagnosis Date Noted   Hyperlipidemia 03/04/2022   HTN (hypertension) 03/04/2022    Malignant neoplasm of upper-outer quadrant of left breast in female, estrogen receptor positive (HCC) 03/23/2021   Cervical radiculopathy 09/06/2017   Hirsutism 06/03/2010   Hyperprolactinemia (HCC) 06/03/2010    REFERRING DIAG: left breast cancer at risk for lymphedema  THERAPY DIAG: Aftercare following surgery for neoplasm  PERTINENT HISTORY: Patient was diagnosed on 03/04/2021 with left grade II invasive ductal carcinoma breast cancer. It measures 1.1 cm and is located in the upper outer quadrant. It is ER/PR positive and HER2 negative with a Ki67 of 15%. She has baseline bilateral feet neuropathy. She has a positive axillary lymph node. Lt lumpectomy with 1/4 lymph nodes positive on 04/02/21. Currently in radiation.   PRECAUTIONS: left UE Lymphedema risk, None  SUBJECTIVE: Pt returns for her last first 6 month L-Dex screen.   PAIN:  Are you having pain? No  SOZO SCREENING: Patient was assessed today using the SOZO machine to determine the lymphedema index score. This was compared to her baseline score. It was determined that she is within the recommended range when compared to her baseline and no further action is needed at this time. She will continue SOZO screenings. These are done every 3 months for 2 years post operatively followed by every 6 months for 2 years, and then annually.   L-DEX  FLOWSHEETS - 12/05/23 1000       L-DEX LYMPHEDEMA SCREENING   Measurement Type Unilateral    L-DEX MEASUREMENT EXTREMITY Upper Extremity    POSITION  Standing    DOMINANT SIDE Right    At Risk Side Left    BASELINE SCORE (UNILATERAL) -0.8    L-DEX SCORE (UNILATERAL) -3.1    VALUE CHANGE (UNILAT) -2.3            P:  6 month L-Dex screens.   Denyce Flank, PTA 12/05/2023, 10:39 AM

## 2023-12-15 ENCOUNTER — Other Ambulatory Visit: Payer: Self-pay | Admitting: Hematology and Oncology

## 2023-12-15 DIAGNOSIS — Z9889 Other specified postprocedural states: Secondary | ICD-10-CM

## 2023-12-20 DIAGNOSIS — Z1211 Encounter for screening for malignant neoplasm of colon: Secondary | ICD-10-CM | POA: Diagnosis not present

## 2023-12-22 DIAGNOSIS — H2513 Age-related nuclear cataract, bilateral: Secondary | ICD-10-CM | POA: Diagnosis not present

## 2024-03-12 ENCOUNTER — Ambulatory Visit
Admission: RE | Admit: 2024-03-12 | Discharge: 2024-03-12 | Disposition: A | Discharge status: Home / Self Care | Source: Ambulatory Visit | Attending: Hematology and Oncology

## 2024-03-12 DIAGNOSIS — R928 Other abnormal and inconclusive findings on diagnostic imaging of breast: Secondary | ICD-10-CM | POA: Diagnosis not present

## 2024-03-12 DIAGNOSIS — R92323 Mammographic fibroglandular density, bilateral breasts: Secondary | ICD-10-CM | POA: Diagnosis not present

## 2024-03-12 DIAGNOSIS — Z9889 Other specified postprocedural states: Secondary | ICD-10-CM

## 2024-05-08 ENCOUNTER — Inpatient Hospital Stay: Payer: Medicare Other | Attending: Hematology and Oncology | Admitting: Hematology and Oncology

## 2024-05-08 VITALS — BP 140/90 | HR 62 | Temp 97.6°F | Resp 18 | Ht 67.0 in | Wt 276.3 lb

## 2024-05-08 DIAGNOSIS — Z1732 Human epidermal growth factor receptor 2 negative status: Secondary | ICD-10-CM | POA: Diagnosis not present

## 2024-05-08 DIAGNOSIS — Z1721 Progesterone receptor positive status: Secondary | ICD-10-CM | POA: Insufficient documentation

## 2024-05-08 DIAGNOSIS — Z79811 Long term (current) use of aromatase inhibitors: Secondary | ICD-10-CM | POA: Insufficient documentation

## 2024-05-08 DIAGNOSIS — Z923 Personal history of irradiation: Secondary | ICD-10-CM | POA: Diagnosis not present

## 2024-05-08 DIAGNOSIS — Z170 Estrogen receptor positive status [ER+]: Secondary | ICD-10-CM | POA: Insufficient documentation

## 2024-05-08 DIAGNOSIS — C773 Secondary and unspecified malignant neoplasm of axilla and upper limb lymph nodes: Secondary | ICD-10-CM | POA: Diagnosis not present

## 2024-05-08 DIAGNOSIS — C50412 Malignant neoplasm of upper-outer quadrant of left female breast: Secondary | ICD-10-CM | POA: Insufficient documentation

## 2024-05-08 NOTE — Assessment & Plan Note (Signed)
 04/02/2021:Left lumpectomy: Grade 2 IDC, 1.1 cm, with intermediate grade DCIS, margins negative, 1/4 lymph nodes positive with extracapsular extension, ER 70%, PR 40%, HER2 equal vocal negative FISH, Ki-67 15%   MammaPrint: Low risk luminal type a  Adjuvant radiation: 05/07/2021-06/09/2021    Treatment plan: Adjuvant antiestrogen therapy (initially letrozole  then tamoxifen  and now anastrozole ) Anastrozole  toxicities: 1.  Hot flashes: Patient could not tolerate tamoxifen , letrozole  and exemestane is a very expensive.  Therefore she will take anastrozole  4 days a week.   Breast cancer surveillance: 1.  mammogram 03/12/2024: Benign breast density category B 2.  Breast exam 05/08/2024: Benign 3.  Bone density 07/25/2023: T-score -0.5: Normal   Return to clinic in 1 year for follow-up

## 2024-05-08 NOTE — Progress Notes (Signed)
 Patient Care Team: Nanci Senior, MD as PCP - General (Family Medicine) [name], MD as Consulting Physician (General Surgery) Odean Potts, MD as Consulting Physician (Hematology and Oncology) Izell Domino, MD as Attending Physician (Radiation Oncology)  DIAGNOSIS:  Encounter Diagnosis  Name Primary?   Malignant neoplasm of upper-outer quadrant of left breast in female, estrogen receptor positive (HCC) Yes    SUMMARY OF ONCOLOGIC HISTORY: Oncology History  Malignant neoplasm of upper-outer quadrant of left breast in female, estrogen receptor positive (HCC)  03/23/2021 Initial Diagnosis   Screening mammogram: a possible mass and abnormal lymph node in the left breast. Diagnostic mammogram and US : suspicious 1.1 cm mass at the left breast 2 o'clock position and enlarged left axillary lymph node. 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  ER 70%, PR 40%, Ki-67 15%, HER2 negative by FISH   03/25/2021 Cancer Staging   Staging form: Breast, AJCC 8th Edition - Clinical stage from 03/25/2021: Stage IB (cT1c, cN1, cM0, G2, ER+, PR+, HER2-) - Signed by Odean Potts, MD on 03/25/2021 Stage prefix: Initial diagnosis Histologic grading system: 3 grade system   04/02/2021 Surgery   Left lumpectomy: Grade 2 IDC, 1.1 cm, with intermediate grade DCIS, margins negative, 1/4 lymph nodes positive with extracapsular extension, ER 70%, PR 40%, HER2 equal vocal negative FISH, Ki-67 15%   04/02/2021 Miscellaneous   Mammaprint: Low risk   05/07/2021 - 06/09/2021 Radiation Therapy   Adjuvant radiation   06/24/2021 -  Anti-estrogen oral therapy   Adjuvant antiestrogen therapy with letrozole  2.5 mg daily     CHIEF COMPLIANT: Surveillance of breast cancer on letrozole  therapy  HISTORY OF PRESENT ILLNESS:  History of Present Illness Christy Strickland is a 73 year old female with estrogen receptor-positive breast cancer who presents with  menopausal symptoms.  She experiences hot flashes described as feeling like 'electricity is running through' her body. She initially took anastrozole  four days a week but has since increased to daily use. Significant fatigue affects her daily life. Joint pain began after starting anastrozole . She has difficulty sleeping, which she attributes to medication side effects, including hot flashes and fatigue.  Occasional sharp pain in her breast followed a mammogram on August 4th, which has since resolved. Her recent mammogram on August 4th showed less dense breast tissue, categorized as B density. A bone density test in December of the previous year was excellent.  She experiences numbness in her toes and fingers and wonders if it could be a side effect of her medication. She has a history of neuropathy in her legs, attributed to past back issues and nerve damage following surgery for a bulging disc.     ALLERGIES:  is allergic to mushroom extract complex (obsolete), amoxicillin-pot clavulanate, amlodipine, and meperidine  hcl.  MEDICATIONS:  Current Outpatient Medications  Medication Sig Dispense Refill   anastrozole  (ARIMIDEX ) 1 MG tablet Take 1 tablet (1 mg total) by mouth daily. 90 tablet 3   cetirizine (ZYRTEC) 10 MG tablet Take 10 mg by mouth daily.     Cholecalciferol 50 MCG (2000 UT) TABS 1 tablet Orally Once a day     Cyanocobalamin  (VITAMIN B12) 1000 MCG TBCR 1 tablet Orally Once a day     hydrochlorothiazide (HYDRODIURIL) 25 MG tablet Take 25 mg by mouth daily.     losartan (COZAAR) 25 MG tablet Take 25 mg by mouth daily.     Multiple Vitamin (MULTIVITAMIN WITH MINERALS) TABS tablet Take 1 tablet  by mouth daily.     naproxen (NAPROSYN) 500 MG tablet Take 500 mg by mouth as needed.     vitamin C (ASCORBIC ACID) 250 MG tablet Take 250 mg by mouth daily.     No current facility-administered medications for this visit.    PHYSICAL EXAMINATION: ECOG PERFORMANCE STATUS: 1 - Symptomatic  but completely ambulatory  Vitals:   05/08/24 1111  BP: (!) 140/90  Pulse: 62  Resp: 18  Temp: 97.6 F (36.4 C)  SpO2: (!) 62%   Filed Weights   05/08/24 1111  Weight: 276 lb 4.8 oz (125.3 kg)    LABORATORY DATA:  I have reviewed the data as listed    Latest Ref Rng & Units 07/06/2022   11:02 AM 05/04/2022   11:02 AM 12/30/2021   11:02 AM  CMP  Glucose 70 - 99 mg/dL 90  98  89   BUN 8 - 23 mg/dL 21  23  23    Creatinine 0.44 - 1.00 mg/dL 8.77  8.85  8.83   Sodium 135 - 145 mmol/L 140  139  140   Potassium 3.5 - 5.1 mmol/L 3.7  3.5  3.9   Chloride 98 - 111 mmol/L 105  104  103   CO2 22 - 32 mmol/L 29  30  32   Calcium 8.9 - 10.3 mg/dL 89.1  89.9  89.3   Total Protein 6.5 - 8.1 g/dL  7.8  7.9   Total Bilirubin 0.3 - 1.2 mg/dL  0.4  0.4   Alkaline Phos 38 - 126 U/L  57  71   AST 15 - 41 U/L  21  21   ALT 0 - 44 U/L  18  20     Lab Results  Component Value Date   WBC 5.5 05/04/2022   HGB 14.5 05/04/2022   HCT 41.5 05/04/2022   MCV 89.4 05/04/2022   PLT 306 05/04/2022   NEUTROABS 3.3 05/04/2022    ASSESSMENT & PLAN:  Malignant neoplasm of upper-outer quadrant of left breast in female, estrogen receptor positive (HCC) 04/02/2021:Left lumpectomy: Grade 2 IDC, 1.1 cm, with intermediate grade DCIS, margins negative, 1/4 lymph nodes positive with extracapsular extension, ER 70%, PR 40%, HER2 equal vocal negative FISH, Ki-67 15%   MammaPrint: Low risk luminal type a  Adjuvant radiation: 05/07/2021-06/09/2021    Treatment plan: Adjuvant antiestrogen therapy (initially letrozole  then tamoxifen  and now anastrozole ) Anastrozole  toxicities: 1.  Hot flashes: Patient could not tolerate tamoxifen , letrozole  and exemestane is a very expensive.  Therefore she will take anastrozole  4 days a week.   Breast cancer surveillance: 1.  mammogram 03/12/2024: Benign breast density category B 2.  Bone density 07/25/2023: T-score -0.5: Normal   Return to clinic in 1 year for  follow-up ------------------------------------- Assessment and Plan Assessment & Plan Estrogen receptor-positive breast cancer, upper-outer quadrant of left breast Three years post-diagnosis with recent mammogram showing less dense breast tissue, indicating lower recurrence risk and improved mammogram sensitivity. - Continue regular mammogram screenings per standard guidelines.  Menopausal symptoms related to anastrozole  therapy Experiencing significant menopausal symptoms impacting daily living. - Adjust anastrozole  intake to three or four days a week to manage side effects. - Monitor symptoms and adjust treatment as necessary to maintain quality of life.  General Health Maintenance Requires routine checkups and blood work. - Refer to Dr. Lafonda at [facility] in [place] for primary care and routine blood work. - Schedule annual follow-up with hematology and oncology for continued monitoring.  No orders of the defined types were placed in this encounter.  The patient has a good understanding of the overall plan. she agrees with it. she will call with any problems that may develop before the next visit here. Total time spent: 30 mins including face to face time and time spent for planning, charting and co-ordination of care   Viinay K Anastasha Ortez, MD 05/08/24

## 2024-06-04 ENCOUNTER — Ambulatory Visit: Attending: General Surgery | Admitting: Rehabilitation

## 2024-06-04 DIAGNOSIS — Z483 Aftercare following surgery for neoplasm: Secondary | ICD-10-CM | POA: Insufficient documentation

## 2024-06-04 NOTE — Therapy (Addendum)
 OUTPATIENT PHYSICAL THERAPY SOZO SCREENING NOTE   Patient Name: Christy Strickland MRN: [id number] DOB:1950/09/21, 73 y.o., female Today's Date: 06/04/2024  PCP: Nanci Senior, MD REFERRING PROVIDER: [name], MD   PT End of Session - 06/04/24 1041     Visit Number 2    PT Start Time 1039    PT Stop Time 1044    PT Time Calculation (min) 5 min    Activity Tolerance Patient tolerated treatment well    Behavior During Therapy WFL for tasks assessed/performed          Past Medical History:  Diagnosis Date   Arthritis    Breast cancer (HCC)    left breast IDC   Hypertension    Neuromuscular disorder (HCC)    neuropathy in feet   Seasonal allergies    Sleep apnea    uses CPAP nightly   Past Surgical History:  Procedure Laterality Date   BACK SURGERY     lower back   bilarteral knee replacements Bilateral    BREAST LUMPECTOMY WITH RADIOACTIVE SEED AND SENTINEL LYMPH NODE BIOPSY Left 04/02/2021   Procedure: LEFT BREAST LUMPECTOMY WITH RADIOACTIVE SEED AND SENTINEL LYMPH NODE BIOPSY;  Surgeon: [name], MD;  Location: [facility];  Service: General;  Laterality: Left;  120 MINUTES ROOM 2 IQ   CESAREAN SECTION     x 2   COLON SURGERY     DIAGNOSTIC LAPAROSCOPY     benign mass   DILATATION & CURETTAGE/HYSTEROSCOPY WITH MYOSURE  04/27/2018   Procedure: DILATATION & CURETTAGE/HYSTEROSCOPY WITH MYOSURE;  Surgeon: Marilynn Nest, DO;  Location: [facility];  Service: Gynecology;;   DILATION AND CURETTAGE OF UTERUS     x 2    RADIOACTIVE SEED GUIDED AXILLARY SENTINEL LYMPH NODE Left 04/02/2021   Procedure: RADIOACTIVE SEED GUIDED AXILLARY SENTINEL LYMPH NODE DISSECTION;  Surgeon: [name], MD;  Location:  SURGERY CENTER;  Service: General;  Laterality: Left;   remove pituitary gland     Patient Active Problem List   Diagnosis Date Noted   Hyperlipidemia 03/04/2022   HTN (hypertension) 03/04/2022   Malignant neoplasm of upper-outer  quadrant of left breast in female, estrogen receptor positive (HCC) 03/23/2021   Cervical radiculopathy 09/06/2017   Hirsutism 06/03/2010   Hyperprolactinemia 06/03/2010    REFERRING DIAG: left breast cancer at risk for lymphedema  THERAPY DIAG: Aftercare following surgery for neoplasm  PERTINENT HISTORY: Patient was diagnosed on 03/04/2021 with left grade II invasive ductal carcinoma breast cancer. It measures 1.1 cm and is located in the upper outer quadrant. It is ER/PR positive and HER2 negative with a Ki67 of 15%. She has baseline bilateral feet neuropathy. She has a positive axillary lymph node. Lt lumpectomy with 1/4 lymph nodes positive on 04/02/21. Currently in radiation.   PRECAUTIONS: left UE Lymphedema risk, None  SUBJECTIVE: Pt returns for her last first 6 month L-Dex screen.   PAIN:  Are you having pain? No  SOZO SCREENING: Patient was assessed today using the SOZO machine to determine the lymphedema index score. This was compared to her baseline score. It was determined that she is within the recommended range when compared to her baseline and no further action is needed at this time. She will continue SOZO screenings. These are done every 3 months for 2 years post operatively followed by every 6 months for 2 years, and then annually.    L-DEX FLOWSHEETS - 06/04/24 1000  L-DEX LYMPHEDEMA SCREENING   Measurement Type Unilateral    L-DEX MEASUREMENT EXTREMITY Upper Extremity    POSITION  Standing    DOMINANT SIDE Right    At Risk Side Left    BASELINE SCORE (UNILATERAL) -0.8    L-DEX SCORE (UNILATERAL) -4.7    VALUE CHANGE (UNILAT) -3.9          P:  6 month L-Dex screens.   Eward Sharps, PT 10:44 06/04/2024

## 2024-06-20 DIAGNOSIS — C44311 Basal cell carcinoma of skin of nose: Secondary | ICD-10-CM | POA: Diagnosis not present

## 2024-08-22 ENCOUNTER — Other Ambulatory Visit (HOSPITAL_COMMUNITY)
Admission: RE | Admit: 2024-08-22 | Discharge: 2024-08-22 | Disposition: A | Discharge status: Home / Self Care | Source: Ambulatory Visit | Attending: Family Medicine | Admitting: Family Medicine

## 2024-08-22 ENCOUNTER — Encounter: Payer: Self-pay | Admitting: Family Medicine

## 2024-08-22 ENCOUNTER — Ambulatory Visit: Admitting: Family Medicine

## 2024-08-22 VITALS — BP 148/90 | HR 66 | Temp 98.0°F | Ht 67.0 in | Wt 279.4 lb

## 2024-08-22 DIAGNOSIS — R7303 Prediabetes: Secondary | ICD-10-CM

## 2024-08-22 DIAGNOSIS — N898 Other specified noninflammatory disorders of vagina: Secondary | ICD-10-CM | POA: Diagnosis present

## 2024-08-22 DIAGNOSIS — G4733 Obstructive sleep apnea (adult) (pediatric): Secondary | ICD-10-CM

## 2024-08-22 DIAGNOSIS — E782 Mixed hyperlipidemia: Secondary | ICD-10-CM

## 2024-08-22 DIAGNOSIS — G629 Polyneuropathy, unspecified: Secondary | ICD-10-CM

## 2024-08-22 DIAGNOSIS — N1831 Chronic kidney disease, stage 3a: Secondary | ICD-10-CM | POA: Diagnosis not present

## 2024-08-22 DIAGNOSIS — L659 Nonscarring hair loss, unspecified: Secondary | ICD-10-CM

## 2024-08-22 DIAGNOSIS — C50412 Malignant neoplasm of upper-outer quadrant of left female breast: Secondary | ICD-10-CM

## 2024-08-22 DIAGNOSIS — R5383 Other fatigue: Secondary | ICD-10-CM | POA: Diagnosis not present

## 2024-08-22 DIAGNOSIS — E7800 Pure hypercholesterolemia, unspecified: Secondary | ICD-10-CM | POA: Insufficient documentation

## 2024-08-22 DIAGNOSIS — I10 Essential (primary) hypertension: Secondary | ICD-10-CM

## 2024-08-22 DIAGNOSIS — G619 Inflammatory polyneuropathy, unspecified: Secondary | ICD-10-CM | POA: Insufficient documentation

## 2024-08-22 DIAGNOSIS — N183 Chronic kidney disease, stage 3 unspecified: Secondary | ICD-10-CM | POA: Insufficient documentation

## 2024-08-22 DIAGNOSIS — Z7689 Persons encountering health services in other specified circumstances: Secondary | ICD-10-CM

## 2024-08-22 DIAGNOSIS — Z1211 Encounter for screening for malignant neoplasm of colon: Secondary | ICD-10-CM

## 2024-08-22 DIAGNOSIS — Z86018 Personal history of other benign neoplasm: Secondary | ICD-10-CM | POA: Insufficient documentation

## 2024-08-22 DIAGNOSIS — M1040 Other secondary gout, unspecified site: Secondary | ICD-10-CM | POA: Diagnosis not present

## 2024-08-22 DIAGNOSIS — N950 Postmenopausal bleeding: Secondary | ICD-10-CM | POA: Insufficient documentation

## 2024-08-22 DIAGNOSIS — Z170 Estrogen receptor positive status [ER+]: Secondary | ICD-10-CM

## 2024-08-22 LAB — CBC WITH DIFFERENTIAL/PLATELET
Basophils Absolute: 0 K/uL (ref 0.0–0.1)
Basophils Relative: 0.8 % (ref 0.0–3.0)
Eosinophils Absolute: 0.1 K/uL (ref 0.0–0.7)
Eosinophils Relative: 1.9 % (ref 0.0–5.0)
HCT: 41.5 % (ref 36.0–46.0)
Hemoglobin: 14.3 g/dL (ref 12.0–15.0)
Lymphocytes Relative: 23.2 % (ref 12.0–46.0)
Lymphs Abs: 1.4 K/uL (ref 0.7–4.0)
MCHC: 34.5 g/dL (ref 30.0–36.0)
MCV: 90.9 fl (ref 78.0–100.0)
Monocytes Absolute: 0.5 K/uL (ref 0.1–1.0)
Monocytes Relative: 7.6 % (ref 3.0–12.0)
Neutro Abs: 4.1 K/uL (ref 1.4–7.7)
Neutrophils Relative %: 66.5 % (ref 43.0–77.0)
Platelets: 308 K/uL (ref 150.0–400.0)
RBC: 4.57 Mil/uL (ref 3.87–5.11)
RDW: 13.2 % (ref 11.5–15.5)
WBC: 6.2 K/uL (ref 4.0–10.5)

## 2024-08-22 LAB — COMPREHENSIVE METABOLIC PANEL WITH GFR
ALT: 20 U/L (ref 3–35)
AST: 21 U/L (ref 5–37)
Albumin: 4.1 g/dL (ref 3.5–5.2)
Alkaline Phosphatase: 67 U/L (ref 39–117)
BUN: 20 mg/dL (ref 6–23)
CO2: 29 meq/L (ref 19–32)
Calcium: 10.7 mg/dL — ABNORMAL HIGH (ref 8.4–10.5)
Chloride: 103 meq/L (ref 96–112)
Creatinine, Ser: 1.08 mg/dL (ref 0.40–1.20)
GFR: 50.68 mL/min — ABNORMAL LOW
Glucose, Bld: 92 mg/dL (ref 70–99)
Potassium: 4.3 meq/L (ref 3.5–5.1)
Sodium: 138 meq/L (ref 135–145)
Total Bilirubin: 0.5 mg/dL (ref 0.2–1.2)
Total Protein: 7.7 g/dL (ref 6.0–8.3)

## 2024-08-22 LAB — IBC + FERRITIN
Ferritin: 64.6 ng/mL (ref 10.0–291.0)
Iron: 139 ug/dL (ref 42–145)
Saturation Ratios: 42.4 % (ref 20.0–50.0)
TIBC: 327.6 ug/dL (ref 250.0–450.0)
Transferrin: 234 mg/dL (ref 212.0–360.0)

## 2024-08-22 LAB — TSH: TSH: 2.54 u[IU]/mL (ref 0.35–5.50)

## 2024-08-22 LAB — VITAMIN D 25 HYDROXY (VIT D DEFICIENCY, FRACTURES): VITD: 23.51 ng/mL — ABNORMAL LOW (ref 30.00–100.00)

## 2024-08-22 LAB — URIC ACID: Uric Acid, Serum: 9.4 mg/dL — ABNORMAL HIGH (ref 2.4–7.0)

## 2024-08-22 LAB — LIPID PANEL
Cholesterol: 218 mg/dL — ABNORMAL HIGH (ref 28–200)
HDL: 42.4 mg/dL
LDL Cholesterol: 126 mg/dL — ABNORMAL HIGH (ref 10–99)
NonHDL: 175.77
Total CHOL/HDL Ratio: 5
Triglycerides: 250 mg/dL — ABNORMAL HIGH (ref 10.0–149.0)
VLDL: 50 mg/dL — ABNORMAL HIGH (ref 0.0–40.0)

## 2024-08-22 LAB — HEMOGLOBIN A1C: Hgb A1c MFr Bld: 6 % (ref 4.6–6.5)

## 2024-08-22 LAB — B12 AND FOLATE PANEL
Folate: 9.1 ng/mL
Vitamin B-12: 170 pg/mL — ABNORMAL LOW (ref 211–911)

## 2024-08-22 MED ORDER — CLOTRIMAZOLE 1 % EX CREA: 1.0000 | TOPICAL_CREAM | Freq: Two times a day (BID) | CUTANEOUS | 0 refills | Status: AC

## 2024-08-22 MED ORDER — LOSARTAN POTASSIUM 100 MG PO TABS: 100.0000 mg | ORAL_TABLET | Freq: Every day | ORAL | 1 refills | Status: AC

## 2024-08-22 NOTE — Progress Notes (Signed)
 "    Patient ID: Christy Strickland, female  DOB: May 23, 1951, 74 y.o.   MRN: [id number] Patient Care Team    Relationship Specialty Notifications Start End  Catherine Charlies LABOR, DO PCP - General Family Medicine  08/22/24   [name], MD Consulting Physician General Surgery  03/24/21   Odean Potts, MD Consulting Physician Hematology and Oncology  03/24/21   Izell Domino, MD Attending Physician Radiation Oncology  03/24/21     Chief Complaint  Patient presents with   Establish Care    Subjective:  Christy Strickland is a 74 y.o.  female present for new patient establishment. All past medical history, surgical history, allergies, family history, immunizations, medications and social history were updated in the electronic medical record today. All recent labs, ED visits and hospitalizations within the last year were reviewed.  Eye exam 12/2023 Tetanus 01/05/2024 Colo FIT 5/13 2025 Cologuard 2018  Primary hypertension (Primary)/HLD/morbid obesity/CKD 3 A Patient reports compliance with losartan  50 mg daily and HCTZ 25 mg daily.Patient denies chest pain, shortness of breath, dizziness or lower extremity edema.    Vaginal itching Patient reports she thinks anastrozole  causes her to have a rash in her genital area.  She had the same rash once before and it had resolved.  Since then it has come back and has become worse, she reports extreme itching has now extended to inner thighs.  She attempts to keep cream on the area.  She reports she does sweat a lot in this area.  She has seen gynecology in the past.  Hair loss/fatigue Patient reports feeling extreme fatigue lack of energy motivation.  She reports ever since she has been diagnosed with breast cancer and then subsequently started on anastrozole  she does not have any energy.  She is tired of being tired.  OSA on CPAP Patient reports her CPAP machine about 75 years old, she had her original sleep study done at that time and it has  not been repeated.  She does endorses compliance with the CPAP.  She states she is extremely fatigued.  Patient reports neuropathy in bilateral feet.  Suspected to be secondary to her breast cancer treatment.  She is wondering if there is anything she can do more, practically at home, she would rather not take a medication.     08/22/2024   10:41 AM  Depression screen PHQ 2/9  Decreased Interest 0  Down, Depressed, Hopeless 0  PHQ - 2 Score 0  Altered sleeping 0  Tired, decreased energy 0  Change in appetite 0  Feeling bad or failure about yourself  0  Trouble concentrating 0  Moving slowly or fidgety/restless 0  Suicidal thoughts 0  PHQ-9 Score 0  Difficult doing work/chores Not difficult at all      08/22/2024   10:41 AM  GAD 7 : Generalized Anxiety Score  Nervous, Anxious, on Edge 0  Control/stop worrying 0  Worry too much - different things 0  Trouble relaxing 0  Restless 0  Easily annoyed or irritable 0  Afraid - awful might happen 0  Total GAD 7 Score 0  Anxiety Difficulty Not difficult at all             08/22/2024   10:41 AM  Fall Risk   Falls in the past year? 0  Number falls in past yr: 0  Injury with Fall? 0  Risk for fall due to : No Fall Risks  Follow up Falls evaluation completed  Immunization History  Administered Date(s) Administered   Influenza Split 05/20/2011   PFIZER(Purple Top)SARS-COV-2 Vaccination 10/11/2019, 11/07/2019, 06/16/2020   PNEUMOCOCCAL CONJUGATE-20 12/21/2021   Tdap 01/05/2024    No results found.  Past Medical History:  Diagnosis Date   Arthritis    Breast cancer (HCC)    left breast IDC   Chicken pox    Hirsutism 06/03/2010   History of prolactinoma 08/22/2024   Hyperprolactinemia 06/03/2010   Hypertension    Neuromuscular disorder (HCC)    neuropathy in feet   Postmenopausal bleeding 08/22/2024   Seasonal allergies    Sleep apnea    uses CPAP nightly   Allergies[1] Past Surgical History:  Procedure  Laterality Date   BACK SURGERY     lower back   bilarteral knee replacements Bilateral    BREAST LUMPECTOMY WITH RADIOACTIVE SEED AND SENTINEL LYMPH NODE BIOPSY Left 04/02/2021   Procedure: LEFT BREAST LUMPECTOMY WITH RADIOACTIVE SEED AND SENTINEL LYMPH NODE BIOPSY;  Surgeon: [name], MD;  Location: [facility];  Service: General;  Laterality: Left;  120 MINUTES ROOM 2 IQ   CESAREAN SECTION     x 2   COLON SURGERY     DIAGNOSTIC LAPAROSCOPY     benign mass   DILATATION & CURETTAGE/HYSTEROSCOPY WITH MYOSURE  04/27/2018   Procedure: DILATATION & CURETTAGE/HYSTEROSCOPY WITH MYOSURE;  Surgeon: Marilynn Nest, DO;  Location: [facility];  Service: Gynecology;;   DILATION AND CURETTAGE OF UTERUS     x 2    RADIOACTIVE SEED GUIDED AXILLARY SENTINEL LYMPH NODE Left 04/02/2021   Procedure: RADIOACTIVE SEED GUIDED AXILLARY SENTINEL LYMPH NODE DISSECTION;  Surgeon: [name], MD;  Location: [facility];  Service: General;  Laterality: Left;   remove pituitary gland     Family History  Problem Relation Age of Onset   Hypertension Mother    Heart attack Father    Social History   Social History Narrative   Marital status/children/pets: Married, 2 adult children   Education/employment: Retired 1   Safety:      -Wears a bicycle helmet riding a bike: Yes     -smoke alarm in the home:Yes     - wears seatbelt: Yes     - Feels safe in their relationships: Yes       Allergies as of 08/22/2024       Reactions   Mushroom Extract Complex (obsolete) Other (See Comments)   Abdominal pain.   Amoxicillin-pot Clavulanate Itching, Other (See Comments)   Has patient had a PCN reaction causing immediate rash, facial/tongue/throat swelling, SOB or lightheadedness with hypotension: No Has patient had a PCN reaction causing severe rash involving mucus membranes or skin necrosis: No Has patient had a PCN reaction that required hospitalization: No Has patient had a PCN  reaction occurring within the last 10 years: No If all of the above answers are NO, then may proceed with Cephalosporin use.   Amlodipine    Other Reaction(s): Pedal Edema   Meperidine  Hcl Anxiety   agitation        Medication List        Accurate as of August 22, 2024 11:59 PM. If you have any questions, ask your nurse or doctor.          STOP taking these medications    Cholecalciferol 50 MCG (2000 UT) Tabs Stopped by: Charlies Bellini, DO   hydrochlorothiazide 25 MG tablet Commonly known as: HYDRODIURIL Stopped by: Janeisha Ryle, DO  multivitamin with minerals Tabs tablet Stopped by: Charlies Bellini, DO   naproxen 500 MG tablet Commonly known as: NAPROSYN Stopped by: Landry Lookingbill, DO   Vitamin B12 1000 MCG Tbcr Stopped by: Amariss Detamore, DO       TAKE these medications    allopurinol  100 MG tablet Commonly known as: ZYLOPRIM  Take 1 tablet (100 mg total) by mouth daily. Started by: Charlies Bellini, DO   anastrozole  1 MG tablet Commonly known as: ARIMIDEX  Take 1 tablet (1 mg total) by mouth daily.   cetirizine 10 MG tablet Commonly known as: ZYRTEC Take 10 mg by mouth daily.   clotrimazole  1 % cream Commonly known as: Clotrimazole  Anti-Fungal Apply 1 Application topically 2 (two) times daily. Started by: Charlies Bellini, DO   losartan  100 MG tablet Commonly known as: COZAAR  Take 1 tablet (100 mg total) by mouth daily. What changed:  medication strength how much to take Another medication with the same name was removed. Continue taking this medication, and follow the directions you see here. Changed by: Jeyda Siebel, DO   Multi Vitamin Tabs 1 tablet Orally Once a day   Super Calcium 1500 (600 Ca) MG Tabs tablet Generic drug: calcium carbonate 1 tablet with meals Orally Twice a day   vitamin C 250 MG tablet Commonly known as: ASCORBIC ACID Take 250 mg by mouth daily.        All past medical history, surgical history, allergies, family history,  immunizations andmedications were updated in the EMR today and reviewed under the history and medication portions of their EMR.    Recent Results (from the past 2160 hours)  CBC w/Diff     Status: None   Collection Time: 08/22/24 11:17 AM  Result Value Ref Range   WBC 6.2 4.0 - 10.5 K/uL   RBC 4.57 3.87 - 5.11 Mil/uL   Hemoglobin 14.3 12.0 - 15.0 g/dL   HCT 58.4 63.9 - 53.9 %   MCV 90.9 78.0 - 100.0 fl   MCHC 34.5 30.0 - 36.0 g/dL   RDW 86.7 88.4 - 84.4 %   Platelets 308.0 150.0 - 400.0 K/uL   Neutrophils Relative % 66.5 43.0 - 77.0 %   Lymphocytes Relative 23.2 12.0 - 46.0 %   Monocytes Relative 7.6 3.0 - 12.0 %   Eosinophils Relative 1.9 0.0 - 5.0 %   Basophils Relative 0.8 0.0 - 3.0 %   Neutro Abs 4.1 1.4 - 7.7 K/uL   Lymphs Abs 1.4 0.7 - 4.0 K/uL   Monocytes Absolute 0.5 0.1 - 1.0 K/uL   Eosinophils Absolute 0.1 0.0 - 0.7 K/uL   Basophils Absolute 0.0 0.0 - 0.1 K/uL  Comp Met (CMET)     Status: Abnormal   Collection Time: 08/22/24 11:17 AM  Result Value Ref Range   Sodium 138 135 - 145 mEq/L   Potassium 4.3 3.5 - 5.1 mEq/L   Chloride 103 96 - 112 mEq/L   CO2 29 19 - 32 mEq/L    Comment: Elevated LDH levels may cause falsely increased CO2 results. If LDH is >2000 U/L, a positive bias of 12% is possible.   Glucose, Bld 92 70 - 99 mg/dL   BUN 20 6 - 23 mg/dL   Creatinine, Ser 8.91 0.40 - 1.20 mg/dL   Total Bilirubin 0.5 0.2 - 1.2 mg/dL   Alkaline Phosphatase 67 39 - 117 U/L   AST 21 5 - 37 U/L   ALT 20 3 - 35 U/L   Total Protein 7.7 6.0 -  8.3 g/dL   Albumin 4.1 3.5 - 5.2 g/dL   GFR 49.31 (L) >39.99 mL/min    Comment: Calculated using the CKD-EPI Creatinine Equation (2021)   Calcium 10.7 (H) 8.4 - 10.5 mg/dL  TSH     Status: None   Collection Time: 08/22/24 11:17 AM  Result Value Ref Range   TSH 2.54 0.35 - 5.50 uIU/mL  Lipid Profile     Status: Abnormal   Collection Time: 08/22/24 11:17 AM  Result Value Ref Range   Cholesterol 218 (H) 28 - 200 mg/dL    Comment: ATP  III Classification       Desirable:  < 200 mg/dL               Borderline High:  200 - 239 mg/dL          High:  > = 759 mg/dL   Triglycerides 749.9 (H) 10.0 - 149.0 mg/dL    Comment: Normal:  <849 mg/dLBorderline High:  150 - 199 mg/dL   HDL 57.59 >60.99 mg/dL   VLDL 49.9 (H) 0.0 - 59.9 mg/dL   LDL Cholesterol 873 (H) 10 - 99 mg/dL   Total CHOL/HDL Ratio 5     Comment:                Men          Women1/2 Average Risk     3.4          3.3Average Risk          5.0          4.42X Average Risk          9.6          7.13X Average Risk          15.0          11.0                       NonHDL 175.77     Comment: NOTE:  Non-HDL goal should be 30 mg/dL higher than patient's LDL goal (i.e. LDL goal of < 70 mg/dL, would have non-HDL goal of < 100 mg/dL)  Hemoglobin J8r     Status: None   Collection Time: 08/22/24 11:17 AM  Result Value Ref Range   Hgb A1c MFr Bld 6.0 4.6 - 6.5 %    Comment: Glycemic Control Guidelines for People with Diabetes:Non Diabetic:  <6%Goal of Therapy: <7%Additional Action Suggested:  >8%   Vitamin D  (25 hydroxy)     Status: Abnormal   Collection Time: 08/22/24 11:17 AM  Result Value Ref Range   VITD 23.51 (L) 30.00 - 100.00 ng/mL  PTH, Intact and Calcium     Status: Abnormal   Collection Time: 08/22/24 11:17 AM  Result Value Ref Range   PTH 30 16 - 77 pg/mL    Comment: . Interpretive Guide    Intact PTH           Calcium ------------------    ----------           ------- Normal Parathyroid    Normal               Normal Hypoparathyroidism    Low or Low Normal    Low Hyperparathyroidism    Primary            Normal or High       High    Secondary  High                 Normal or Low    Tertiary           High                 High Non-Parathyroid    Hypercalcemia      Low or Low Normal    High .    Calcium 10.7 (H) 8.6 - 10.4 mg/dL  Uric acid     Status: Abnormal   Collection Time: 08/22/24 11:17 AM  Result Value Ref Range   Uric Acid, Serum 9.4 (H) 2.4 -  7.0 mg/dL  IBC + Ferritin     Status: None   Collection Time: 08/22/24 11:17 AM  Result Value Ref Range   Iron 139 42 - 145 ug/dL   Transferrin 765.9 787.9 - 360.0 mg/dL   Saturation Ratios 57.5 20.0 - 50.0 %   Ferritin 64.6 10.0 - 291.0 ng/mL   TIBC 327.6 250.0 - 450.0 mcg/dL  A87 and Folate Panel     Status: Abnormal   Collection Time: 08/22/24 11:17 AM  Result Value Ref Range   Vitamin B-12 170 (L) 211 - 911 pg/mL   Folate 9.1 >5.9 ng/mL       ROS 14 pt review of systems performed and negative (unless mentioned in an HPI)  Objective: BP (!) 148/90   Pulse 66   Temp 98 F (36.7 C)   Ht 5' 7 (1.702 m)   Wt 279 lb 6.4 oz (126.7 kg)   LMP  (LMP Unknown)   SpO2 97%   BMI 43.76 kg/m  Physical Exam Vitals and nursing note reviewed.  Constitutional:      General: She is not in acute distress.    Appearance: Normal appearance. She is obese. She is not ill-appearing, toxic-appearing or diaphoretic.  HENT:     Head: Normocephalic and atraumatic.  Eyes:     General: No scleral icterus.       Right eye: No discharge.        Left eye: No discharge.     Extraocular Movements: Extraocular movements intact.     Conjunctiva/sclera: Conjunctivae normal.     Pupils: Pupils are equal, round, and reactive to light.  Neck:     Comments: No thyromegaly Cardiovascular:     Rate and Rhythm: Normal rate and regular rhythm.     Heart sounds: No murmur heard. Pulmonary:     Effort: Pulmonary effort is normal. No respiratory distress.     Breath sounds: Normal breath sounds. No wheezing, rhonchi or rales.  Musculoskeletal:     Cervical back: Neck supple.     Right lower leg: No edema.     Left lower leg: No edema.  Skin:    General: Skin is warm.     Findings: No rash.  Neurological:     Mental Status: She is alert and oriented to person, place, and time. Mental status is at baseline.     Motor: No weakness.     Gait: Gait normal.  Psychiatric:        Mood and Affect: Mood  normal.        Behavior: Behavior normal.        Thought Content: Thought content normal.        Judgment: Judgment normal.     Assessment/plan: Rahaf Carbonell is a 74 y.o. female present for establishment of care with multiple chronic conditions and  acute concerns. Establishing care with new doctor, encounter for Colon cancer screening Has only had FIT test the last few years, Cologuard in 2018, prior to that was a colonoscopy a few decades ago - Cologuard  Primary hypertension (Primary)/hyperlipidemia/morbid obesity Above goal. Goal<130/80 Increase losartan  to 100 mg daily Stop HCTZ and use only if needed for edema - CBC w/Diff - Comp Met (CMET) - TSH - Lipid Profile  Prediabetes/Morbid obesity (HCC) Low glycemic index diet and routine exercise recommended - Hemoglobin A1c  Stage 3a chronic kidney disease (HCC) - Comp Met (CMET) - Vitamin D  (25 hydroxy)> 23.51> add 2000 units of vitamin D3 daily - PTH, Intact and Calcium> mildly elevated calcium> increase hydration, will discuss in more detail at next appointment. - Uric acid> high> start allopurinol  -Avoid NSAIDs -Renally dose meds when appropriate -Monitor PTH/calcium and vitamin D  yearly  Secondary gout - Uric acid -Uric acid goal <6.  She is currently not on any medications for that. -Consider starting allopurinol  if levels above goal.  Vaginal itching - Cervicovaginal ancillary only( [place]) -Antifungal cream to start, will consider antifungal pill once results return. -Plan on following up in 4 weeks for all conditions, if symptoms still present we will likely move onto atrophic vaginitis treatment with steroid cream.  Hair loss/fatigue - IBC + Ferritin Possibly multifactorial with her chronic conditions. We discussed starting workup today and scheduling a close follow-up to reevaluate and discuss all labs.  OSA on CPAP Patient report increased fatigue and lack of energy.  She was diagnosed with  OSA and required CPAP approximately 20 years ago.  She has not had a repeat test and the machine is the original machine. 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  Malignant neoplasm of upper-outer quadrant of left breast-ER positive, PR positive, HER2 negative Follows closely with oncology for monitoring Prescribed anastrozole   Return in about 4 weeks (around 09/19/2024).  Orders Placed This Encounter  Procedures   HM DEXA SCAN   CBC w/Diff   Comp Met (CMET)   TSH   Lipid Profile   Hemoglobin A1c   Vitamin D  (25 hydroxy)   PTH, Intact and Calcium   Uric acid   Cologuard   IBC + Ferritin   B12 and Folate Panel   Cologuard   Ambulatory referral to Pulmonology   Meds ordered this encounter  Medications   losartan  (COZAAR ) 100 MG tablet    Sig: Take 1 tablet (100 mg total) by mouth daily.    Dispense:  90 tablet    Refill:  1   clotrimazole  (CLOTRIMAZOLE  ANTI-FUNGAL) 1 % cream    Sig: Apply 1 Application topically 2 (two) times daily.    Dispense:  60 g    Refill:  0   allopurinol  (ZYLOPRIM ) 100 MG tablet    Sig: Take 1 tablet (100 mg total) by mouth daily.    Dispense:  30 tablet    Refill:  6   Referral Orders         Ambulatory referral to Pulmonology       Note is dictated utilizing voice recognition software. Although note has been proof read prior to signing, occasional typographical errors still can be missed. If  any questions arise, please do not hesitate to call for verification.  Electronically signed by: Charlies Bellini, DO [facility]     [1]  Allergies Allergen Reactions   Mushroom Extract Complex (Obsolete) Other (See Comments)    Abdominal pain.    Amoxicillin-Pot Clavulanate Itching and Other (See Comments)    Has patient had a PCN reaction causing immediate rash, facial/tongue/throat swelling, SOB or lightheadedness with hypotension: No Has patient had a PCN reaction causing severe rash involving mucus membranes or skin necrosis: No Has patient had a PCN reaction that required hospitalization: No Has patient had a PCN reaction occurring within the last 10 years: No If all of the above answers are NO, then may proceed with Cephalosporin use.    Amlodipine     Other Reaction(s): Pedal Edema   Meperidine  Hcl Anxiety    agitation   "

## 2024-08-22 NOTE — Patient Instructions (Signed)

## 2024-08-23 ENCOUNTER — Ambulatory Visit: Payer: Self-pay | Admitting: Family Medicine

## 2024-08-23 LAB — CERVICOVAGINAL ANCILLARY ONLY
Bacterial Vaginitis (gardnerella): NEGATIVE
Candida Glabrata: NEGATIVE
Candida Vaginitis: NEGATIVE
Comment: NEGATIVE
Comment: NEGATIVE
Comment: NEGATIVE

## 2024-08-23 LAB — PTH, INTACT AND CALCIUM
Calcium: 10.7 mg/dL — ABNORMAL HIGH (ref 8.6–10.4)
PTH: 30 pg/mL (ref 16–77)

## 2024-08-23 MED ORDER — ALLOPURINOL 100 MG PO TABS: 100.0000 mg | ORAL_TABLET | Freq: Every day | ORAL | 6 refills | Status: AC

## 2024-08-23 NOTE — Telephone Encounter (Signed)
 Please call patient Blood cell counts, thyroid levels, iron levels, liver function, parathyroid hormone levels are in normal range. B12 is severely low at 170.  I recommend she start the sublingual/under the tongue B12 1000 mcg solution that is over-the-counter as soon as possible.  I would also recommend she set up to have B12 injections by nurse visit once every 2 weeks for 4 doses, then monthly.  B12 was low as well possibly why she is feeling so fatigued and possibly contributing to her neuropathy as well. Her vitamin D  is mildly low at 23.5, goal is at least greater than 30.  I would recommend she start an over-the-counter D3 2000 units daily Her calcium is mildly elevated, if she is still taking a calcium pill, I would recommend she decrease the dose by half.  And hydrate very well. Her uric acid levels, which is a marker for gout are high at greater than 9.  Highly uric acid levels are not good for the body, joints and kidneys excetra.  Recommend she start a medication called allopurinol  1 tab daily, this decreases the uric acid levels in the body.  I have called this into her pharmacy.  We will recheck her levels at her follow-up and increase the dose of allopurinol  if necessary.  Goal uric acid is<6 Cholesterol panel is mildly above goal, we will discuss this in more detail at her follow-up in a couple weeks. Kidney function is mildly lower than normal, but overall stable. I called in the antifungal cream, for now I think we should wait on the oral antifungal until we see each other again since we are starting other supplements and medications  Please make sure she has follow-up scheduled in 4 weeks

## 2024-08-24 NOTE — Telephone Encounter (Signed)
 No further action needed at this time.

## 2024-08-27 ENCOUNTER — Ambulatory Visit

## 2024-08-27 DIAGNOSIS — E538 Deficiency of other specified B group vitamins: Secondary | ICD-10-CM | POA: Diagnosis not present

## 2024-08-27 MED ORDER — CYANOCOBALAMIN 1000 MCG/ML IJ SOLN
1000.0000 ug | Freq: Once | INTRAMUSCULAR | Status: AC
  Administered 2024-08-27: 1000 ug via INTRAMUSCULAR

## 2024-08-27 NOTE — Progress Notes (Signed)
 Pt here for Bi-weekly B12 injection per Dr. Catherine.   Pt tolerated injection well.   Next injection scheduled for 2 weeks.

## 2024-09-10 ENCOUNTER — Ambulatory Visit

## 2024-09-13 ENCOUNTER — Ambulatory Visit

## 2024-09-13 DIAGNOSIS — E538 Deficiency of other specified B group vitamins: Secondary | ICD-10-CM

## 2024-09-13 MED ORDER — CYANOCOBALAMIN 1000 MCG/ML IJ SOLN
1000.0000 ug | Freq: Once | INTRAMUSCULAR | Status: AC
  Administered 2024-09-13: 1000 ug via INTRAMUSCULAR

## 2024-09-13 NOTE — Progress Notes (Signed)
 Pt here for 2/4 biweekly B12 injection per Dr. Catherine.    Pt tolerated injection well, RA only.   Next injection scheduled for 2 weeks.   B12 injections by nurse visit once every 2 weeks for 4 doses, then monthly.

## 2024-09-19 ENCOUNTER — Ambulatory Visit: Admitting: Family Medicine

## 2024-09-20 ENCOUNTER — Ambulatory Visit: Admitting: Family Medicine

## 2024-09-27 ENCOUNTER — Ambulatory Visit

## 2024-12-03 ENCOUNTER — Ambulatory Visit

## 2025-05-08 ENCOUNTER — Inpatient Hospital Stay: Admitting: Hematology and Oncology
